# Patient Record
Sex: Male | Born: 1935 | Race: White | Hispanic: No | Marital: Married | Smoking: Never smoker
Health system: Southern US, Community
[De-identification: ages and names within clinical notes are randomized; demographics above are authoritative.]

## PROBLEM LIST (undated history)

## (undated) DIAGNOSIS — M51369 Other intervertebral disc degeneration, lumbar region without mention of lumbar back pain or lower extremity pain: Secondary | ICD-10-CM

## (undated) DIAGNOSIS — Z87442 Personal history of urinary calculi: Secondary | ICD-10-CM

## (undated) DIAGNOSIS — I10 Essential (primary) hypertension: Secondary | ICD-10-CM

## (undated) DIAGNOSIS — I447 Left bundle-branch block, unspecified: Secondary | ICD-10-CM

## (undated) DIAGNOSIS — M5136 Other intervertebral disc degeneration, lumbar region: Secondary | ICD-10-CM

## (undated) DIAGNOSIS — Z8719 Personal history of other diseases of the digestive system: Secondary | ICD-10-CM

## (undated) DIAGNOSIS — C4491 Basal cell carcinoma of skin, unspecified: Secondary | ICD-10-CM

---
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
## 2003-12-12 ENCOUNTER — Ambulatory Visit (HOSPITAL_COMMUNITY): Admission: RE | Admit: 2003-12-12 | Discharge: 2003-12-12 | Payer: Self-pay | Admitting: Gastroenterology

## 2010-11-11 ENCOUNTER — Encounter
Admission: RE | Admit: 2010-11-11 | Discharge: 2010-11-11 | Payer: Self-pay | Source: Home / Self Care | Attending: Gastroenterology | Admitting: Gastroenterology

## 2011-04-09 NOTE — Op Note (Signed)
NAME:  Albert Woods, Albert Woods NO.:  [id number]   MEDICAL RECORD NO.:  [id number]                   PATIENT TYPE:  AMB   LOCATION:  ENDO                                 FACILITY:  [facility]   PHYSICIAN:  Danise Edge, M.D.                DATE OF BIRTH:  04/27/1935   DATE OF PROCEDURE:  12/11/2002  DATE OF DISCHARGE:                                 OPERATIVE REPORT   PROCEDURE:  Colonoscopy.   PROCEDURE INDICATION:  [name] is a 76 year old male, born [birth year].  [name] had an episode of painless hematochezia with resolved.  Proctocolonoscopy to the cecum in November 1997 was normal.   CHRONIC MEDICATIONS:  None.   MEDICATION ALLERGIES:  PENICILLIN.   PAST MEDICAL HISTORY:  1. 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  2. Foot surgery on two occasions.  3. Kidney stones.  4. Prostatitis.  5. Ingrown toenail surgery.  6. Operation on the right third finger.  7. Peptic ulcer disease.  8. Normal proctocolonoscopy to the cecum, 1997.   HEALTH MAINTENANCE:  1. Tetanus toxoid booster, 1987  2. Pneumovax, September 2003.  3. Influenza vaccination, October 2004.   ENDOSCOPIST:  Charolett Bumpers, M.D.   PREMEDICATION:  1. Versed 5 mg.  2. Demerol 50 mg.   DESCRIPTION OF PROCEDURE:  After obtaining informed consent, Albert Woods was  placed in the left lateral decubitus position.  I administered intravenous  Demerol and intravenous Versed to achieve conscious sedation for the  procedure.  The patient's blood pressure, oxygen saturation, and cardiac  rhythm were monitored throughout the procedure and documented in the medical  record.   Anal inspection was normal.  Digital rectal exam revealed an enlarged but  nonnodular prostate.  The Olympus adjustable pediatric colonoscope was  introduced into the rectum and advanced to the cecum.  Colonic preparation  for the exam today was excellent.   RECTUM:  Normal.  SIGMOID COLON AND  DESCENDING COLON:  Left colonic diverticulosis without  diverticulitis or diverticular stricture formation.  SPLENIC FLEXURE:  Normal.  TRANSVERSE COLON:  Normal.  HEPATIC FLEXURE:  Normal.  ASCENDING COLON:  Normal.  CECUM AND ILEOCECAL VALVE:  Normal.   ASSESSMENT:  Normal screening proctocolonoscopy to the cecum.  No endoscopic  evidence for the presence of colorectal neoplasia, gastrointestinal  bleeding.                                               Danise Edge, M.D.    MJ/MEDQ  D:  12/12/2003  T:  12/12/2003  Job:  [id number]   cc:   Rozanna Boer., M.D.  [address]  [place]  [place] [address]  Fax: [phone]

## 2013-11-22 DIAGNOSIS — I447 Left bundle-branch block, unspecified: Secondary | ICD-10-CM

## 2013-11-22 HISTORY — DX: Left bundle-branch block, unspecified: I44.7

## 2014-09-18 ENCOUNTER — Emergency Department (HOSPITAL_COMMUNITY): Payer: Medicare Other

## 2014-09-18 ENCOUNTER — Inpatient Hospital Stay (HOSPITAL_COMMUNITY)
Admission: EM | Admit: 2014-09-18 | Discharge: 2014-09-19 | DRG: 310 | Disposition: A | Discharge status: Home / Self Care | Payer: Medicare Other | Attending: Cardiovascular Disease | Admitting: Cardiovascular Disease

## 2014-09-18 ENCOUNTER — Encounter (HOSPITAL_COMMUNITY): Payer: Self-pay | Admitting: Emergency Medicine

## 2014-09-18 DIAGNOSIS — I10 Essential (primary) hypertension: Secondary | ICD-10-CM | POA: Diagnosis present

## 2014-09-18 DIAGNOSIS — I447 Left bundle-branch block, unspecified: Principal | ICD-10-CM | POA: Diagnosis present

## 2014-09-18 DIAGNOSIS — I440 Atrioventricular block, first degree: Secondary | ICD-10-CM

## 2014-09-18 DIAGNOSIS — K219 Gastro-esophageal reflux disease without esophagitis: Secondary | ICD-10-CM | POA: Diagnosis present

## 2014-09-18 DIAGNOSIS — Z880 Allergy status to penicillin: Secondary | ICD-10-CM

## 2014-09-18 DIAGNOSIS — R9431 Abnormal electrocardiogram [ECG] [EKG]: Secondary | ICD-10-CM

## 2014-09-18 DIAGNOSIS — R079 Chest pain, unspecified: Secondary | ICD-10-CM | POA: Diagnosis present

## 2014-09-18 DIAGNOSIS — R072 Precordial pain: Secondary | ICD-10-CM

## 2014-09-18 HISTORY — DX: Essential (primary) hypertension: I10

## 2014-09-18 LAB — CBC
HCT: 46.1 % (ref 39.0–52.0)
Hemoglobin: 15.7 g/dL (ref 13.0–17.0)
MCH: 28.7 pg (ref 26.0–34.0)
MCHC: 34.1 g/dL (ref 30.0–36.0)
MCV: 84.3 fL (ref 78.0–100.0)
PLATELETS: 296 10*3/uL (ref 150–400)
RBC: 5.47 MIL/uL (ref 4.22–5.81)
RDW: 14.4 % (ref 11.5–15.5)
WBC: 7.3 10*3/uL (ref 4.0–10.5)

## 2014-09-18 LAB — BASIC METABOLIC PANEL
ANION GAP: 14 (ref 5–15)
BUN: 21 mg/dL (ref 6–23)
CALCIUM: 9.9 mg/dL (ref 8.4–10.5)
CO2: 26 mEq/L (ref 19–32)
Chloride: 98 mEq/L (ref 96–112)
Creatinine, Ser: 1.01 mg/dL (ref 0.50–1.35)
GFR, EST AFRICAN AMERICAN: 81 mL/min — AB (ref >90)
GFR, EST NON AFRICAN AMERICAN: 70 mL/min — AB (ref >90)
Glucose, Bld: 109 mg/dL — ABNORMAL HIGH (ref 70–99)
POTASSIUM: 4.2 meq/L (ref 3.7–5.3)
SODIUM: 138 meq/L (ref 137–147)

## 2014-09-18 LAB — I-STAT TROPONIN, ED: TROPONIN I, POC: 0 ng/mL (ref 0.00–0.08)

## 2014-09-18 MED ORDER — HYDROCHLOROTHIAZIDE 25 MG PO TABS
25.0000 mg | ORAL_TABLET | Freq: Every day | ORAL | Status: DC
Start: 2014-09-18 — End: 2014-09-19
  Administered 2014-09-18 – 2014-09-19 (×2): 25 mg via ORAL
  Filled 2014-09-18 (×2): qty 1

## 2014-09-18 MED ORDER — ASPIRIN EC 81 MG PO TBEC
81.0000 mg | DELAYED_RELEASE_TABLET | Freq: Every day | ORAL | Status: DC
  Administered 2014-09-19: 81 mg via ORAL
  Filled 2014-09-18: qty 1

## 2014-09-18 MED ORDER — REGADENOSON 0.4 MG/5ML IV SOLN
0.4000 mg | Freq: Once | INTRAVENOUS | Status: AC
  Administered 2014-09-19: 0.4 mg via INTRAVENOUS
  Filled 2014-09-18: qty 5

## 2014-09-18 MED ORDER — ACETAMINOPHEN 325 MG PO TABS: 650.0000 mg | ORAL_TABLET | ORAL | Status: DC | PRN

## 2014-09-18 MED ORDER — HEPARIN SODIUM (PORCINE) 5000 UNIT/ML IJ SOLN
5000.0000 [IU] | Freq: Three times a day (TID) | INTRAMUSCULAR | Status: DC
  Filled 2014-09-18 (×3): qty 1

## 2014-09-18 MED ORDER — NITROGLYCERIN 0.4 MG SL SUBL: 0.4000 mg | SUBLINGUAL_TABLET | SUBLINGUAL | Status: DC | PRN

## 2014-09-18 NOTE — ED Notes (Signed)
Attempted report x1. 

## 2014-09-18 NOTE — ED Provider Notes (Signed)
CSN: [id number]     Arrival date & time 09/18/14  1050 History   First MD Initiated Contact with Patient 09/18/14 1111     Chief Complaint  Patient presents with  . Gastrophageal Reflux  . Chest Pain     (Consider location/radiation/quality/duration/timing/severity/associated sxs/prior Treatment) HPI Comments: Patient is a 79 yo M PMHx significant for HTN presenting to the emergency department from [place] urgent care center for EKG abnormalities. Patient states he went to the urgent care center for 3 day history of left lower chest pain that he describes as indigestion or a pulled muscle. He denies any nausea, vomiting, shortness of breath, abdominal pain, fever, chills, leg swelling. Patient states his symptoms have improved after GI cocktail at the urgent care center. He states he has never had chest pain in the past, has not had an EKG since the 70s at [place] primary care office which she was told was normal. He has never had an echocardiogram, cardiac catheterization, stress test.  Patient is a 79 y.o. male presenting with GERD and chest pain.  Gastrophageal Reflux Associated symptoms include chest pain.  Chest Pain   Past Medical History  Diagnosis Date  . Hypertension    History reviewed. No pertinent past surgical history. History reviewed. No pertinent family history. History  Substance Use Topics  . Smoking status: Never Smoker   . Smokeless tobacco: Not on file  . Alcohol Use: No    Review of Systems  Cardiovascular: Positive for chest pain.  All other systems reviewed and are negative.     Allergies  Penicillins  Home Medications   Prior to Admission medications   Medication Sig Start Date End Date Taking? Authorizing Provider  fluticasone (FLONASE) 50 MCG/ACT nasal spray Place 1 spray into both nostrils daily as needed for allergies or rhinitis.   Yes Historical Provider, MD  hydrochlorothiazide (HYDRODIURIL) 25 MG tablet Take 25 mg by mouth daily.   Yes  Historical Provider, MD  ibuprofen (ADVIL,MOTRIN) 200 MG tablet Take 200 mg by mouth every 6 (six) hours as needed for mild pain.   Yes Historical Provider, MD  Multiple Vitamin (MULTIVITAMIN WITH MINERALS) TABS tablet Take 1 tablet by mouth daily.   Yes Historical Provider, MD   BP 129/84  Pulse 63  Temp(Src) 97.6 F (36.4 C)  Resp 24  Ht 5\' 8"  (1.727 m)  Wt 180 lb (81.647 kg)  BMI 27.38 kg/m2  SpO2 95% Physical Exam  Nursing note and vitals reviewed. Constitutional: He is oriented to person, place, and time. He appears well-developed and well-nourished. No distress.  HENT:  Head: Normocephalic and atraumatic.  Right Ear: External ear normal.  Left Ear: External ear normal.  Nose: Nose normal.  Mouth/Throat: Oropharynx is clear and moist. No oropharyngeal exudate.  Eyes: Conjunctivae are normal.  Neck: Neck supple.  Cardiovascular: Normal rate, regular rhythm, normal heart sounds and intact distal pulses.   Pulmonary/Chest: Effort normal and breath sounds normal. No respiratory distress. He exhibits no tenderness.  Abdominal: Soft. Bowel sounds are normal. There is no tenderness.  Neurological: He is alert and oriented to person, place, and time.  Skin: Skin is warm and dry. No rash noted. He is not diaphoretic.    ED Course  Procedures (including critical care time) Medications - No data to display  Labs Review Labs Reviewed  BASIC METABOLIC PANEL - Abnormal; Notable for the following:    Glucose, Bld 109 (*)    GFR calc non Af Amer 70 (*)  GFR calc Af Amer 81 (*)    All other components within normal limits  CBC  I-STAT TROPOININ, ED    Imaging Review Dg Chest Port 1 View  09/18/2014   CLINICAL DATA:  EKG abnormalities. Atypical chest pain. Indigestion. Hypertension.  EXAM: PORTABLE CHEST - 1 VIEW  COMPARISON:  None.  FINDINGS: The heart size and mediastinal contours are within normal limits. Both lungs are clear. Probable small to moderate hiatal hernia noted.  The visualized skeletal structures are unremarkable.  IMPRESSION: No active lung disease.  Probable small to moderate hiatal hernia.   Electronically Signed   By: Earle Gell M.D.   On: 09/18/2014 14:07     EKG Interpretation   Date/Time:  Wednesday September 18 2014 10:55:50 EDT Ventricular Rate:  81 PR Interval:  218 QRS Duration: 158 QT Interval:  398 QTC Calculation: 462 R Axis:   24 Text Interpretation:  Sinus rhythm with 1st degree A-V block with  Premature atrial complexes Left bundle branch block Abnormal ECG Confirmed  by Jeneen Rinks  MD, [place] ([id number]) on 09/18/2014 11:13:04 AM      MDM   Final diagnoses:  EKG abnormalities   Filed Vitals:   09/18/14 1500  BP: 129/84  Pulse: 63  Temp:   Resp: 24   Afebrile, NAD, non-toxic appearing, AAOx4.   Patient sent from [facility] for EKG Abnormalities along with three days of left sided chest pain. Noted to have a LBBB, no history of LBBB. Patient states the only EKG he has had in the past was in the 1970s which was normal. Patient is CP free currently. Labs reviewed. CXR reviewed. Cardiology was consulted and will see the patient and admit for cardiac enzyme cycling with possible stress test in the AM for evaluation for ischemia. Patient d/w with Dr. Jeneen Rinks, agrees with plan.      Harlow Mares, PA-C 09/18/14 1644  Tanna Furry, MD 09/27/14 [phone]

## 2014-09-18 NOTE — H&P (Signed)
Patient ID: Albert Woods MRN: [id number], DOB/AGE: 1935-08-17   Admit date: 09/18/2014  Primary Physician: Garlan Fair, MD Primary Cardiologist: new  Pt. Profile:  Albert Woods is a 79 year old male who is being seen today for consultation regarding a three day history of left sided chest pain.  Problem List  Past Medical History  Diagnosis Date  . Hypertension     History reviewed. No pertinent past surgical history.   Allergies  Allergies  Allergen Reactions  . Penicillins     HPI  Albert Woods is a 79 year old caucasian male who is being seen today for consultation regarding left sided chest pain that has been present for three days.  Other than a history of hypertension, for which he takes HCTZ 25mg , he does not have any additional cardiac history.  Last ECG was in the 1970's, and has never had stress test, echo, catheterization, or cardiac surgeries.  Father had no cardiac history, mother had murmur that was present since birth, and one brother had 2 MI's and 1 CVA and is living at 76 years of age.  The chest pain occurred at rest and has been present since Monday evening and describes pain as indigestion that is "burning" with the sensation of a "pulled muscle." He admits to lifting a heavy generator off of his truck Monday afternoon.  Burning pain was relieved Monday night with a Tums, returned Tuesday night with temporary relief of Tums and Omeprazole. Pain then returned this morning so he then presented to an urgent care center in [place] where an ECG was performed noting an irregular rhythm and was given a GI cocktail which alleviated the pain completely.  During episodes of left sided chest pain he did not have shortness of breath, dizziness, diaphoresis, palpitations, or radiation of pain.  No pain at this time.   Home Medications  Prior to Admission medications   Not on File    Family History  History reviewed. No pertinent family history. Mother had murmur  that was present at birth, Brother- MI x 2 and CVA. Currently living at 77 years of age.  Social History  History   Social History  . Marital Status: Single    Spouse Name: N/A    Number of Children: N/A  . Years of Education: N/A   Occupational History  . Not on file.   Social History Main Topics  . Smoking status: Never Smoker   . Smokeless tobacco: Not on file  . Alcohol Use: No  . Drug Use: Not on file  . Sexual Activity: Not on file   Other Topics Concern  . Not on file   Social History Narrative  . No narrative on file     Review of Systems General:  No chills, fever, night sweats or weight changes.  Cardiovascular:  No dyspnea on exertion, edema, orthopnea, palpitations, paroxysmal nocturnal dyspnea. Left sided intermittent "burning" chest pain since Monday evening, relieved temporarily with Tums. Dermatological: No rash, lesions/masses Respiratory: No cough, dyspnea Urologic: No hematuria, dysuria Abdominal:   No nausea, vomiting, diarrhea, bright red blood per rectum, melena, or hematemesis Neurologic:  No visual changes, wkns, changes in mental status. All other systems reviewed and are otherwise negative except as noted above.  Physical Exam  Blood pressure 140/77, pulse 63, temperature 97.6 F (36.4 C), resp. rate 18, height 5\' 8"  (1.727 m), weight 180 lb (81.647 kg), SpO2 96.00%.  General: Pleasant, NAD Psych: Normal affect. Neuro: Alert and  oriented X 3. Moves all extremities spontaneously. HEENT: Normal  Neck: Supple without bruits or JVD. Lungs:  Resp regular and unlabored, CTA. Heart: Rate WNL, rhythm irregular on monitor, no murmurs.  Abdomen: Soft, non-tender, non-distended, BS + x 4.  Extremities: No clubbing, cyanosis or edema. DP/PT/Radials 2+ and equal bilaterally.  Labs  Troponin [facility] of Care Test)  Recent Labs  09/18/14 1149  TROPIPOC 0.00   No results found for this basename: CKTOTAL, CKMB, TROPONINI,  in the last 72 hours Lab  Results  Component Value Date   WBC 7.3 09/18/2014   HGB 15.7 09/18/2014   HCT 46.1 09/18/2014   MCV 84.3 09/18/2014   PLT 296 09/18/2014    Recent Labs Lab 09/18/14 1102  NA 138  K 4.2  CL 98  CO2 26  BUN 21  CREATININE 1.01  CALCIUM 9.9  GLUCOSE 109*   Radiology/Studies  No results found.  ECG  Sinus rhythm with 1st degree AV block and LBBB.  ASSESSMENT AND PLAN  1.  Chest pain: Three day history of left sided chest pain that is described as "burning" in nature with the sensation of a "pulled muscle" which has been temporarily relieved with Tums. He presented to an urgent care center in [place] this morning where an irregular rhythm was noted on ECG and was sent to [facility] for further evaluation. POC tropoin is negative. CXR unremarkable other than a probable small hiatal hernia. CBC and BMP unremarkable. Currently chest pain free. Although symptoms are somewhat atypical, he is 79 and has never had an ischemic evaluation. Would recommend admitting to observation overnight. Will continue to cycle cardiac enzymes and plan for NST in the am if negative. Can place on a trial of PPI therapy with Protonix. NPO at midnight. Fasting lipid panel in the am for risk assessment. Repeat EKG in the am.   1. HTN: BP is well controlled. Continue home HCTZ.    Signed, [place], [place] 09/18/2014  I have seen and examined the patient along with Lyda Jester, PA.  I have reviewed the chart, notes and new data.  I agree with PA's note.  Key new complaints: chest pain symptoms sound more consistent with GI rather than cardiac etiology Key examination changes: frequent ectopy, paradoxically split S2 Key new findings / data: newly recognized LBBB  PLAN: Evaluation for CAD is more challenging due to LBBB. Reasonable to perform Lexiscan Myoview in AM if normal serial enzymes.  Sanda Klein, MD, [place] [phone] 09/18/2014, 4:13 PM

## 2014-09-18 NOTE — ED Notes (Signed)
Per pt went to se PCP about indigestion and chest pain. Sent here for abnormal EKG. Denies pain and was given gi cocktail at office.

## 2014-09-18 NOTE — ED Notes (Signed)
Patient given water per MD approval. 

## 2014-09-18 NOTE — ED Notes (Signed)
Portable Xray at the bedside. Approved patient.

## 2014-09-18 NOTE — ED Notes (Signed)
Pt states "Monday I was eating peanuts and began to have left sided chest/rib pain (points to side of rib cage rather than front of chest), I also had indigestion and took tums with no relief." Still c/o indigestion.

## 2014-09-19 ENCOUNTER — Encounter (HOSPITAL_COMMUNITY): Payer: Self-pay | Admitting: Physician Assistant

## 2014-09-19 ENCOUNTER — Inpatient Hospital Stay (HOSPITAL_COMMUNITY): Payer: Medicare Other

## 2014-09-19 DIAGNOSIS — R072 Precordial pain: Secondary | ICD-10-CM

## 2014-09-19 DIAGNOSIS — R079 Chest pain, unspecified: Secondary | ICD-10-CM

## 2014-09-19 LAB — BASIC METABOLIC PANEL
Anion gap: 12 (ref 5–15)
BUN: 20 mg/dL (ref 6–23)
CO2: 27 meq/L (ref 19–32)
Calcium: 9.6 mg/dL (ref 8.4–10.5)
Chloride: 100 mEq/L (ref 96–112)
Creatinine, Ser: 0.94 mg/dL (ref 0.50–1.35)
GFR calc non Af Amer: 79 mL/min — ABNORMAL LOW (ref >90)
Glucose, Bld: 92 mg/dL (ref 70–99)
POTASSIUM: 4 meq/L (ref 3.7–5.3)
SODIUM: 139 meq/L (ref 137–147)

## 2014-09-19 LAB — LIPID PANEL
CHOL/HDL RATIO: 3 ratio
CHOLESTEROL: 160 mg/dL (ref 0–200)
HDL: 53 mg/dL (ref >39)
LDL Cholesterol: 91 mg/dL (ref 0–99)
Triglycerides: 78 mg/dL (ref <150)
VLDL: 16 mg/dL (ref 0–40)

## 2014-09-19 MED ORDER — PANTOPRAZOLE SODIUM 40 MG PO TBEC: 40.0000 mg | DELAYED_RELEASE_TABLET | Freq: Every day | ORAL | Status: DC

## 2014-09-19 MED ORDER — REGADENOSON 0.4 MG/5ML IV SOLN
INTRAVENOUS | Status: AC
  Administered 2014-09-19: 0.4 mg via INTRAVENOUS
  Filled 2014-09-19: qty 5

## 2014-09-19 MED ORDER — ASPIRIN 81 MG PO TBEC: 81.0000 mg | DELAYED_RELEASE_TABLET | Freq: Every day | ORAL | Status: DC

## 2014-09-19 MED ORDER — TECHNETIUM TC 99M SESTAMIBI - CARDIOLITE
10.0000 | Freq: Once | INTRAVENOUS | Status: AC | PRN
  Administered 2014-09-19: 10 via INTRAVENOUS

## 2014-09-19 MED ORDER — TECHNETIUM TC 99M SESTAMIBI - CARDIOLITE
30.0000 | Freq: Once | INTRAVENOUS | Status: AC | PRN
  Administered 2014-09-19: 30 via INTRAVENOUS

## 2014-09-19 NOTE — Discharge Summary (Signed)
  Physician Discharge Summary  Patient ID: Albert Woods MRN: [id number] DOB/AGE: 77/06/1935 77 y.o.  Primary Cardiologist: Dr. Sallyanne Kuster (new)  Admit date: 09/18/2014 Discharge date: 09/19/2014  Admission Diagnoses: Atypical chest pain with left bundle branch block  Discharge Diagnoses:  Active Problems:   Chest pain   Discharged Condition: stable  Hospital Course: The patient is a 79 y/o male with a history of HTN on HCTZ and no prior cardiac history, who presented to the [facility] ER on 09/18/14 with atypical chest pain, described as left sided sharp pain directly below his rib cage, relieved with belching and antacids. He denied substernal chest pressure, tightness and no exertional component. No other associated symptoms. EKG in the ER demonstrated a LBBB. This was newly recognized as there were no prior EKGs to compare to. Given his LBBB and chest pain, he was admitted for observation. Cardiac enzymes were negative. He had no further pain overnight . He underwent a NST that was low risk for ischemia. EF was normal at 55%. Vital signs remained stable. He was seen and examined by Dr. Angelena Form who determined he was stable for discharge home. He was prescribed Protonix for possible GERD. He will f/u with Dr. Sallyanne Kuster as needed.   Consults: None  Significant Diagnostic Studies:   NST 09/19/14 FINDINGS:  Perfusion: No decreased activity in the left ventricle on stress  imaging to suggest reversible ischemia or infarction.  Wall Motion: Normal left ventricular wall motion. No left  ventricular dilation.  Left Ventricular Ejection Fraction: 55 %  End diastolic volume 559 ml  End systolic volume 48 ml  IMPRESSION:  1. No reversible ischemia or infarction.  2. Normal left ventricular wall motion.  3. Left ventricular ejection fraction 55%  4. Low-risk stress test findings*.   Treatments: See Hospital Course  Discharge Exam: Blood pressure 115/51, pulse 66, temperature 98.4 F (36.9  C), temperature source Oral, resp. rate 18, height 5' 7.5" (1.715 m), weight 172 lb 6.4 oz (78.2 kg), SpO2 96.00%.   Disposition: Final discharge disposition not confirmed      Discharge Instructions   Diet - low sodium heart healthy    Complete by:  As directed      Increase activity slowly    Complete by:  As directed             Medication List         aspirin 81 MG EC tablet  Take 1 tablet (81 mg total) by mouth daily.     fluticasone 50 MCG/ACT nasal spray  Commonly known as:  FLONASE  Place 1 spray into both nostrils daily as needed for allergies or rhinitis.     hydrochlorothiazide 25 MG tablet  Commonly known as:  HYDRODIURIL  Take 25 mg by mouth daily.     ibuprofen 200 MG tablet  Commonly known as:  ADVIL,MOTRIN  Take 200 mg by mouth every 6 (six) hours as needed for mild pain.     multivitamin with minerals Tabs tablet  Take 1 tablet by mouth daily.     pantoprazole 40 MG tablet  Commonly known as:  PROTONIX  Take 1 tablet (40 mg total) by mouth daily.       TIME SPENT ON DISCHARGE INCLUDING PHYSICIAN TIME: >30 MINUTES   Signed: Lyda Jester 09/19/2014, 5:20 PM

## 2014-09-19 NOTE — Progress Notes (Signed)
Patient discharged to home, transported by wife.  IV removed prior to discharge; IV site clean, dry, and intact.  Discharge instructions, education, and medications discussed with patient and wife prior to discharge; both voice understanding of information presented and deny questions or concerns.  Prescription for aspirin EC not provided; per Brittainy, Cardiology PA, patient can take OTC 81mg  aspirin as ordered.  Patient and wife aware.

## 2014-09-19 NOTE — Progress Notes (Signed)
  Patient Profile: 79 y/o male with no prior cardiac history, admitted for evaluation of atypical chest pain and newly recognized LBBB (no prior EKG to compare to).   Subjective: No recurrent CP since admission.   Objective: Vital signs in last 24 hours: Temp:  [97.6 F (36.4 C)-98.4 F (36.9 C)] 98 F (36.7 C) (10/29 0435) Pulse Rate:  [29-88] 66 (10/29 0435) Resp:  [14-24] 16 (10/29 0435) BP: (109-156)/(57-96) 138/67 mmHg (10/29 0955) SpO2:  [93 %-99 %] 96 % (10/29 0435) Weight:  [172 lb 6.4 oz (78.2 kg)-180 lb (81.647 kg)] 172 lb 6.4 oz (78.2 kg) (10/29 0435) Last BM Date: 09/18/14  Intake/Output from previous day: 10/28 0701 - 10/29 0700 In: 240 [P.O.:240] Out: 600 [Urine:600] Intake/Output this shift:    Medications Current Facility-Administered Medications  Medication Dose Route Frequency Provider Last Rate Last Dose  . acetaminophen (TYLENOL) tablet 650 mg  650 mg Oral Q4H PRN Brittainy M Simmons, PA-C      . aspirin EC tablet 81 mg  81 mg Oral Daily Brittainy M Simmons, PA-C      . heparin injection 5,000 Units  5,000 Units Subcutaneous 3 times per day Brittainy Erie Noe, PA-C      . hydrochlorothiazide (HYDRODIURIL) tablet 25 mg  25 mg Oral Daily Brittainy M Simmons, PA-C   25 mg at 09/18/14 2026  . nitroGLYCERIN (NITROSTAT) SL tablet 0.4 mg  0.4 mg Sublingual Q5 Min x 3 PRN Brittainy Erie Noe, PA-C        PE: General appearance: alert, cooperative and no distress Neck: no carotid bruit and no JVD Lungs: clear to auscultation bilaterally Heart: regular rate and rhythm, S1, S2 normal, no murmur, click, rub or gallop Extremities: no LEE Pulses: 2+ and symmetric Skin: warm and dry Neurologic: Grossly normal  Lab Results:   Recent Labs  09/18/14 1102  WBC 7.3  HGB 15.7  HCT 46.1  PLT 296   BMET  Recent Labs  09/18/14 1102 09/19/14 0430  NA 138 139  K 4.2 4.0  CL 98 100  CO2 26 27  GLUCOSE 109* 92  BUN 21 20  CREATININE 1.01 0.94  CALCIUM 9.9  9.6   PT/INR No results found for this basename: LABPROT, INR,  in the last 72 hours Cholesterol  Recent Labs  09/19/14 0430  CHOL 160    Assessment/Plan    Active Problems:   Chest pain  1. Atypical Chest pain: troponin negative. No further recurrence since admission. Lexiscan NST competed. Radiologist interpretation to follow. D/C home if normal. Patient feels recent discomfort may be related to gas. Symptoms relieved with belching and antacids. If NST is negative for ischemia, can discharge with trial of PPI therapy with Protonix.   Note: lipid panel is WNL with LDL <100. If w/u suggest no CAD then no indication for statin.     LOS: 1 day    Brittainy M. Ladoris Gene 09/19/2014 10:21 AM  I have personally seen and examined this patient with Lyda Jester, PA-C I agree with the assessment and plan as outlined above. Chest pain is atypical, no recurrence. If stress test is ok, can go home today.   MCALHANY,CHRISTOPHER 09/19/2014 11:26 AM

## 2014-09-19 NOTE — Care Management Note (Signed)
    Page 1 of 1   09/19/2014     3:27:38 PM CARE MANAGEMENT NOTE 09/19/2014  Patient:  Albert Woods, Albert Woods   Account Number:  [id number]  Date Initiated:  09/19/2014  Documentation initiated by:  [facility]  Subjective/Objective Assessment:   79 year old male who is being seen today for consultation regarding a three day history of left sided chest pain.//Home with spouse.     Action/Plan:   cycle cardiac enzymes and NST   Anticipated DC Date:  09/19/2014   Anticipated DC Plan:  HOME/SELF CARE         Choice offered to / List presented to:             Status of service:  Completed, signed off Medicare Important Message given?   (If response is "NO", the following Medicare IM given date fields will be blank) Date Medicare IM given:   Medicare IM given by:   Date Additional Medicare IM given:   Additional Medicare IM given by:    Discharge Disposition:    Per UR Regulation:  Reviewed for med. necessity/level of care/duration of stay  If discussed at [place] of Stay Meetings, dates discussed:    Comments:

## 2014-11-28 DIAGNOSIS — J309 Allergic rhinitis, unspecified: Secondary | ICD-10-CM | POA: Diagnosis not present

## 2015-01-22 DIAGNOSIS — I10 Essential (primary) hypertension: Secondary | ICD-10-CM | POA: Diagnosis not present

## 2015-01-22 DIAGNOSIS — M4806 Spinal stenosis, lumbar region: Secondary | ICD-10-CM | POA: Diagnosis not present

## 2015-01-22 DIAGNOSIS — E780 Pure hypercholesterolemia: Secondary | ICD-10-CM | POA: Diagnosis not present

## 2015-01-22 DIAGNOSIS — Z23 Encounter for immunization: Secondary | ICD-10-CM | POA: Diagnosis not present

## 2015-01-22 DIAGNOSIS — H269 Unspecified cataract: Secondary | ICD-10-CM | POA: Diagnosis not present

## 2015-01-22 DIAGNOSIS — Z0001 Encounter for general adult medical examination with abnormal findings: Secondary | ICD-10-CM | POA: Diagnosis not present

## 2015-01-22 DIAGNOSIS — I447 Left bundle-branch block, unspecified: Secondary | ICD-10-CM | POA: Diagnosis not present

## 2015-01-22 DIAGNOSIS — H9190 Unspecified hearing loss, unspecified ear: Secondary | ICD-10-CM | POA: Diagnosis not present

## 2015-03-12 DIAGNOSIS — N200 Calculus of kidney: Secondary | ICD-10-CM | POA: Diagnosis not present

## 2015-03-12 DIAGNOSIS — N5201 Erectile dysfunction due to arterial insufficiency: Secondary | ICD-10-CM | POA: Diagnosis not present

## 2015-06-19 DIAGNOSIS — L570 Actinic keratosis: Secondary | ICD-10-CM | POA: Diagnosis not present

## 2015-06-19 DIAGNOSIS — L821 Other seborrheic keratosis: Secondary | ICD-10-CM | POA: Diagnosis not present

## 2015-06-19 DIAGNOSIS — D1801 Hemangioma of skin and subcutaneous tissue: Secondary | ICD-10-CM | POA: Diagnosis not present

## 2015-08-25 DIAGNOSIS — L570 Actinic keratosis: Secondary | ICD-10-CM | POA: Diagnosis not present

## 2015-09-08 DIAGNOSIS — Z23 Encounter for immunization: Secondary | ICD-10-CM | POA: Diagnosis not present

## 2015-10-15 DIAGNOSIS — M25511 Pain in right shoulder: Secondary | ICD-10-CM | POA: Diagnosis not present

## 2016-01-28 DIAGNOSIS — I10 Essential (primary) hypertension: Secondary | ICD-10-CM | POA: Diagnosis not present

## 2016-01-28 DIAGNOSIS — I447 Left bundle-branch block, unspecified: Secondary | ICD-10-CM | POA: Diagnosis not present

## 2016-01-28 DIAGNOSIS — E7800 Pure hypercholesterolemia, unspecified: Secondary | ICD-10-CM | POA: Diagnosis not present

## 2016-01-28 DIAGNOSIS — H9190 Unspecified hearing loss, unspecified ear: Secondary | ICD-10-CM | POA: Diagnosis not present

## 2016-01-28 DIAGNOSIS — M4806 Spinal stenosis, lumbar region: Secondary | ICD-10-CM | POA: Diagnosis not present

## 2016-01-28 DIAGNOSIS — Z0000 Encounter for general adult medical examination without abnormal findings: Secondary | ICD-10-CM | POA: Diagnosis not present

## 2016-01-28 DIAGNOSIS — H269 Unspecified cataract: Secondary | ICD-10-CM | POA: Diagnosis not present

## 2016-05-31 DIAGNOSIS — R21 Rash and other nonspecific skin eruption: Secondary | ICD-10-CM | POA: Diagnosis not present

## 2016-05-31 DIAGNOSIS — W57XXXA Bitten or stung by nonvenomous insect and other nonvenomous arthropods, initial encounter: Secondary | ICD-10-CM | POA: Diagnosis not present

## 2016-06-21 DIAGNOSIS — D1801 Hemangioma of skin and subcutaneous tissue: Secondary | ICD-10-CM | POA: Diagnosis not present

## 2016-06-21 DIAGNOSIS — L821 Other seborrheic keratosis: Secondary | ICD-10-CM | POA: Diagnosis not present

## 2016-06-21 DIAGNOSIS — L570 Actinic keratosis: Secondary | ICD-10-CM | POA: Diagnosis not present

## 2016-08-19 DIAGNOSIS — Z23 Encounter for immunization: Secondary | ICD-10-CM | POA: Diagnosis not present

## 2016-10-19 DIAGNOSIS — L570 Actinic keratosis: Secondary | ICD-10-CM | POA: Diagnosis not present

## 2016-10-19 DIAGNOSIS — L821 Other seborrheic keratosis: Secondary | ICD-10-CM | POA: Diagnosis not present

## 2017-02-02 DIAGNOSIS — H9190 Unspecified hearing loss, unspecified ear: Secondary | ICD-10-CM | POA: Diagnosis not present

## 2017-02-02 DIAGNOSIS — E7800 Pure hypercholesterolemia, unspecified: Secondary | ICD-10-CM | POA: Diagnosis not present

## 2017-02-02 DIAGNOSIS — M48061 Spinal stenosis, lumbar region without neurogenic claudication: Secondary | ICD-10-CM | POA: Diagnosis not present

## 2017-02-02 DIAGNOSIS — I447 Left bundle-branch block, unspecified: Secondary | ICD-10-CM | POA: Diagnosis not present

## 2017-02-02 DIAGNOSIS — I10 Essential (primary) hypertension: Secondary | ICD-10-CM | POA: Diagnosis not present

## 2017-02-02 DIAGNOSIS — Z0001 Encounter for general adult medical examination with abnormal findings: Secondary | ICD-10-CM | POA: Diagnosis not present

## 2017-03-31 DIAGNOSIS — H2513 Age-related nuclear cataract, bilateral: Secondary | ICD-10-CM | POA: Diagnosis not present

## 2017-03-31 DIAGNOSIS — H353120 Nonexudative age-related macular degeneration, left eye, stage unspecified: Secondary | ICD-10-CM | POA: Diagnosis not present

## 2017-06-22 DIAGNOSIS — D485 Neoplasm of uncertain behavior of skin: Secondary | ICD-10-CM | POA: Diagnosis not present

## 2017-06-22 DIAGNOSIS — C44319 Basal cell carcinoma of skin of other parts of face: Secondary | ICD-10-CM | POA: Diagnosis not present

## 2017-06-22 DIAGNOSIS — L570 Actinic keratosis: Secondary | ICD-10-CM | POA: Diagnosis not present

## 2017-06-22 DIAGNOSIS — L821 Other seborrheic keratosis: Secondary | ICD-10-CM | POA: Diagnosis not present

## 2017-06-22 DIAGNOSIS — D2261 Melanocytic nevi of right upper limb, including shoulder: Secondary | ICD-10-CM | POA: Diagnosis not present

## 2017-10-10 DIAGNOSIS — Z23 Encounter for immunization: Secondary | ICD-10-CM | POA: Diagnosis not present

## 2017-11-26 DIAGNOSIS — R42 Dizziness and giddiness: Secondary | ICD-10-CM | POA: Diagnosis not present

## 2017-12-26 DIAGNOSIS — L570 Actinic keratosis: Secondary | ICD-10-CM | POA: Diagnosis not present

## 2017-12-26 DIAGNOSIS — Z85828 Personal history of other malignant neoplasm of skin: Secondary | ICD-10-CM | POA: Diagnosis not present

## 2017-12-26 DIAGNOSIS — D485 Neoplasm of uncertain behavior of skin: Secondary | ICD-10-CM | POA: Diagnosis not present

## 2017-12-26 DIAGNOSIS — L821 Other seborrheic keratosis: Secondary | ICD-10-CM | POA: Diagnosis not present

## 2017-12-26 DIAGNOSIS — D225 Melanocytic nevi of trunk: Secondary | ICD-10-CM | POA: Diagnosis not present

## 2017-12-26 DIAGNOSIS — C44311 Basal cell carcinoma of skin of nose: Secondary | ICD-10-CM | POA: Diagnosis not present

## 2018-01-07 DIAGNOSIS — J0100 Acute maxillary sinusitis, unspecified: Secondary | ICD-10-CM | POA: Diagnosis not present

## 2018-01-11 DIAGNOSIS — C44311 Basal cell carcinoma of skin of nose: Secondary | ICD-10-CM | POA: Diagnosis not present

## 2018-01-11 DIAGNOSIS — Z85828 Personal history of other malignant neoplasm of skin: Secondary | ICD-10-CM | POA: Diagnosis not present

## 2018-02-22 DIAGNOSIS — Z0000 Encounter for general adult medical examination without abnormal findings: Secondary | ICD-10-CM | POA: Diagnosis not present

## 2018-02-22 DIAGNOSIS — M4807 Spinal stenosis, lumbosacral region: Secondary | ICD-10-CM | POA: Diagnosis not present

## 2018-02-22 DIAGNOSIS — Z87442 Personal history of urinary calculi: Secondary | ICD-10-CM | POA: Diagnosis not present

## 2018-02-22 DIAGNOSIS — Z87438 Personal history of other diseases of male genital organs: Secondary | ICD-10-CM | POA: Diagnosis not present

## 2018-02-22 DIAGNOSIS — E785 Hyperlipidemia, unspecified: Secondary | ICD-10-CM | POA: Diagnosis not present

## 2018-02-22 DIAGNOSIS — I10 Essential (primary) hypertension: Secondary | ICD-10-CM | POA: Diagnosis not present

## 2018-02-22 DIAGNOSIS — Z23 Encounter for immunization: Secondary | ICD-10-CM | POA: Diagnosis not present

## 2018-02-22 DIAGNOSIS — H9190 Unspecified hearing loss, unspecified ear: Secondary | ICD-10-CM | POA: Diagnosis not present

## 2018-02-22 DIAGNOSIS — I447 Left bundle-branch block, unspecified: Secondary | ICD-10-CM | POA: Diagnosis not present

## 2018-02-22 DIAGNOSIS — Z1389 Encounter for screening for other disorder: Secondary | ICD-10-CM | POA: Diagnosis not present

## 2018-07-04 DIAGNOSIS — L570 Actinic keratosis: Secondary | ICD-10-CM | POA: Diagnosis not present

## 2018-07-04 DIAGNOSIS — D692 Other nonthrombocytopenic purpura: Secondary | ICD-10-CM | POA: Diagnosis not present

## 2018-07-04 DIAGNOSIS — Z85828 Personal history of other malignant neoplasm of skin: Secondary | ICD-10-CM | POA: Diagnosis not present

## 2018-07-04 DIAGNOSIS — L821 Other seborrheic keratosis: Secondary | ICD-10-CM | POA: Diagnosis not present

## 2018-08-03 DIAGNOSIS — R21 Rash and other nonspecific skin eruption: Secondary | ICD-10-CM | POA: Diagnosis not present

## 2018-11-02 DIAGNOSIS — Z23 Encounter for immunization: Secondary | ICD-10-CM | POA: Diagnosis not present

## 2018-11-24 DIAGNOSIS — R05 Cough: Secondary | ICD-10-CM | POA: Diagnosis not present

## 2019-01-03 DIAGNOSIS — B029 Zoster without complications: Secondary | ICD-10-CM | POA: Diagnosis not present

## 2019-03-26 ENCOUNTER — Emergency Department (HOSPITAL_COMMUNITY)
Admission: EM | Admit: 2019-03-26 | Discharge: 2019-03-26 | Disposition: A | Discharge status: Home / Self Care | Payer: Medicare Other | Attending: Emergency Medicine | Admitting: Emergency Medicine

## 2019-03-26 ENCOUNTER — Encounter (HOSPITAL_COMMUNITY): Payer: Self-pay | Admitting: *Deleted

## 2019-03-26 ENCOUNTER — Telehealth (HOSPITAL_COMMUNITY): Payer: Self-pay | Admitting: Emergency Medicine

## 2019-03-26 ENCOUNTER — Telehealth: Payer: Self-pay | Admitting: Surgery

## 2019-03-26 ENCOUNTER — Emergency Department (HOSPITAL_COMMUNITY): Payer: Medicare Other

## 2019-03-26 ENCOUNTER — Other Ambulatory Visit: Payer: Self-pay

## 2019-03-26 DIAGNOSIS — N130 Hydronephrosis with ureteropelvic junction obstruction: Secondary | ICD-10-CM | POA: Insufficient documentation

## 2019-03-26 DIAGNOSIS — N201 Calculus of ureter: Secondary | ICD-10-CM | POA: Diagnosis not present

## 2019-03-26 DIAGNOSIS — N3001 Acute cystitis with hematuria: Secondary | ICD-10-CM | POA: Diagnosis not present

## 2019-03-26 DIAGNOSIS — I10 Essential (primary) hypertension: Secondary | ICD-10-CM | POA: Insufficient documentation

## 2019-03-26 DIAGNOSIS — N390 Urinary tract infection, site not specified: Secondary | ICD-10-CM

## 2019-03-26 DIAGNOSIS — R10813 Right lower quadrant abdominal tenderness: Secondary | ICD-10-CM | POA: Diagnosis not present

## 2019-03-26 DIAGNOSIS — N200 Calculus of kidney: Secondary | ICD-10-CM | POA: Insufficient documentation

## 2019-03-26 DIAGNOSIS — R319 Hematuria, unspecified: Secondary | ICD-10-CM | POA: Diagnosis not present

## 2019-03-26 DIAGNOSIS — K449 Diaphragmatic hernia without obstruction or gangrene: Secondary | ICD-10-CM | POA: Diagnosis not present

## 2019-03-26 DIAGNOSIS — Z79899 Other long term (current) drug therapy: Secondary | ICD-10-CM | POA: Insufficient documentation

## 2019-03-26 DIAGNOSIS — Z8719 Personal history of other diseases of the digestive system: Secondary | ICD-10-CM

## 2019-03-26 DIAGNOSIS — R1031 Right lower quadrant pain: Secondary | ICD-10-CM | POA: Diagnosis not present

## 2019-03-26 DIAGNOSIS — Z7982 Long term (current) use of aspirin: Secondary | ICD-10-CM | POA: Diagnosis not present

## 2019-03-26 HISTORY — DX: Personal history of other diseases of the digestive system: Z87.19

## 2019-03-26 LAB — URINALYSIS, ROUTINE W REFLEX MICROSCOPIC
Bilirubin Urine: NEGATIVE
Glucose, UA: NEGATIVE mg/dL
Ketones, ur: NEGATIVE mg/dL
Nitrite: NEGATIVE
Protein, ur: NEGATIVE mg/dL
RBC / HPF: >50 RBC/hpf — ABNORMAL HIGH (ref 0–5)
Specific Gravity, Urine: 1.017 (ref 1.005–1.030)
pH: 5 (ref 5.0–8.0)

## 2019-03-26 LAB — CBC
HCT: 44.9 % (ref 39.0–52.0)
Hemoglobin: 14.4 g/dL (ref 13.0–17.0)
MCH: 29.1 pg (ref 26.0–34.0)
MCHC: 32.1 g/dL (ref 30.0–36.0)
MCV: 90.7 fL (ref 80.0–100.0)
Platelets: 444 10*3/uL — ABNORMAL HIGH (ref 150–400)
RBC: 4.95 MIL/uL (ref 4.22–5.81)
RDW: 14.1 % (ref 11.5–15.5)
WBC: 12.4 10*3/uL — ABNORMAL HIGH (ref 4.0–10.5)
nRBC: 0 % (ref 0.0–0.2)

## 2019-03-26 LAB — BASIC METABOLIC PANEL
Anion gap: 9 (ref 5–15)
BUN: 25 mg/dL — ABNORMAL HIGH (ref 8–23)
CO2: 24 mmol/L (ref 22–32)
Calcium: 9.8 mg/dL (ref 8.9–10.3)
Chloride: 107 mmol/L (ref 98–111)
Creatinine, Ser: 1.15 mg/dL (ref 0.61–1.24)
GFR calc Af Amer: >60 mL/min (ref >60)
GFR calc non Af Amer: 59 mL/min — ABNORMAL LOW (ref >60)
Glucose, Bld: 130 mg/dL — ABNORMAL HIGH (ref 70–99)
Potassium: 4.6 mmol/L (ref 3.5–5.1)
Sodium: 140 mmol/L (ref 135–145)

## 2019-03-26 MED ORDER — OXYCODONE-ACETAMINOPHEN 5-325 MG PO TABS: 1.0000 | ORAL_TABLET | ORAL | 0 refills | Status: DC | PRN

## 2019-03-26 MED ORDER — SULFAMETHOXAZOLE-TRIMETHOPRIM 400-80 MG PO TABS: 1.0000 | ORAL_TABLET | Freq: Two times a day (BID) | ORAL | 0 refills | Status: DC

## 2019-03-26 NOTE — Telephone Encounter (Signed)
ED CM received call from patient and spouse concerning pain med prescription, CM routed message to Dr. Hart Robinsons EDP. Prescription was e-scribed in to [facility] in [place]. CM updated spouse.

## 2019-03-26 NOTE — Telephone Encounter (Signed)
Patient did not want pain meds but now requesting Will eprescribe 10 percocet

## 2019-03-26 NOTE — ED Provider Notes (Signed)
[facility] EMERGENCY DEPARTMENT Provider Note   CSN: [id number] Arrival date & time: 03/26/19  1058    History   Chief Complaint Chief Complaint  Patient presents with   Flank Pain    HPI Albert Woods is a 84 y.o. male.     HPI  84 year old man with past medical history of hypertension presents today complaining of left flank pain that began last night.  Describes it as similar to previous kidney stone type pain with sharp right-sided flank pain.  He states the pain was present most of the night pain has come and gone today.  Last occurred when he was in route to the ED.  He has stopped.  He states that he want to move around and become comfortable.  He has not noted any frequency with urination.  He was seen at an urgent care center and it was noted that he had some blood in his urine.  He has not noted any visual blood in his urine.  He is not on blood thinners.  Is not had fever, chills, chest pain, cough, and exposure to carbon 19 that he knows of. He did have some nausea with the pain but has not vomited.  He took.  Smiling but has not drank since he has been going to the urgent care.  He presents here she has been seen previously by needed urologist.  Primary care is Dr. Mertha Finders.  He was seen today at an urgent care center in [place].Marland Kitchen  He has not taken any medications and does not take medication except for that evaluation. Past Medical History:  Diagnosis Date   Hypertension     Patient Active Problem List   Diagnosis Date Noted   Chest pain 09/18/2014    Past Surgical History:  Procedure Laterality Date   FOOT SURGERY Right 1950        Home Medications    Prior to Admission medications   Medication Sig Start Date End Date Taking? Authorizing Provider  aspirin EC 81 MG EC tablet Take 1 tablet (81 mg total) by mouth daily. 09/19/14   Lyda Jester M, PA-C  fluticasone (FLONASE) 50 MCG/ACT nasal spray Place 1 spray into both  nostrils daily as needed for allergies or rhinitis.    [provider]  hydrochlorothiazide (HYDRODIURIL) 25 MG tablet Take 25 mg by mouth daily.    [provider]  ibuprofen (ADVIL,MOTRIN) 200 MG tablet Take 200 mg by mouth every 6 (six) hours as needed for mild pain.    [provider]  Multiple Vitamin (MULTIVITAMIN WITH MINERALS) TABS tablet Take 1 tablet by mouth daily.    [provider]  pantoprazole (PROTONIX) 40 MG tablet Take 1 tablet (40 mg total) by mouth daily. 09/19/14   Consuelo Pandy, PA-C    Family History Family History  Problem Relation Age of Onset   Heart attack Brother    Stroke Brother     Social History Social History   Tobacco Use   Smoking status: Never Smoker  Substance Use Topics   Alcohol use: No   Drug use: No     Allergies   Penicillins   Review of Systems Review of Systems  All other systems reviewed and are negative.    Physical Exam Updated Vital Signs BP 138/71 (BP Location: Right Arm)    Pulse 87    Temp 98.7 F (37.1 C) (Oral)    Resp 20    Wt 78  kg    SpO2 98%    BMI 26.54 kg/m   Physical Exam Vitals signs and nursing note reviewed. Exam conducted with a chaperone present.  Constitutional:      General: He is not in acute distress.    Appearance: Normal appearance. He is normal weight. He is not ill-appearing or diaphoretic.  HENT:     Head: Normocephalic and atraumatic.     Right Ear: External ear normal.     Left Ear: External ear normal.     Nose: Nose normal.     Mouth/Throat:     Mouth: Mucous membranes are moist.  Eyes:     Pupils: Pupils are equal, round, and reactive to light.  Neck:     Musculoskeletal: Normal range of motion.  Cardiovascular:     Rate and Rhythm: Regular rhythm.     Pulses: Normal pulses.     Heart sounds: Normal heart sounds.  Pulmonary:     Effort: Pulmonary effort is normal.     Breath sounds: Normal breath sounds.  Abdominal:      General: Abdomen is flat. Bowel sounds are normal. There is no distension.     Palpations: There is no mass.     Tenderness: There is no abdominal tenderness.     Hernia: No hernia is present.  Genitourinary:    Penis: Normal.      Scrotum/Testes: Normal.  Musculoskeletal: Normal range of motion.  Skin:    General: Skin is warm.     Capillary Refill: Capillary refill takes less than 2 seconds.  Neurological:     General: No focal deficit present.     Mental Status: He is alert.  Psychiatric:        Mood and Affect: Mood normal.      ED Treatments / Results  Labs (all labs ordered are listed, but only abnormal results are displayed) Labs Reviewed  URINALYSIS, ROUTINE W REFLEX MICROSCOPIC  CBC  BASIC METABOLIC PANEL    EKG None  Radiology No results found.  Procedures Procedures (including critical care time)  Medications Ordered in ED Medications - No data to display   Initial Impression / Assessment and Plan / ED Course  I have reviewed the triage vital signs and the nursing notes.  Pertinent labs & imaging results that were available during my care of the patient were reviewed by me and considered in my medical decision making (see chart for details).       84 yo male with right flank pain with right 7 mm stone.  Urine with some wbc, but patient without fever, chills and currently pain free.  He is allergic to penicillin.  Discussed care with Dr. Alfonse Spruce.  Plan Bactrim.  Follow-up as arranged with Dr. Jeffie Pollock at 2:00 on Wednesday afternoon in his office.  Patient is informed.  Discussed return precautions including fever, worsening pain, nausea vomiting and patient voices understanding.   Final Clinical Impressions(s) / ED Diagnoses   Final diagnoses:  Right ureteral stone  Urinary tract infection with hematuria, site unspecified    ED Discharge Orders    None       Pattricia Boss, MD 03/26/19 [phone]

## 2019-03-26 NOTE — ED Triage Notes (Signed)
Pt is here for right sided flank pain which began last pm.  Pt with hx of kidney stones. No fever or chills with this.

## 2019-03-26 NOTE — Discharge Instructions (Addendum)
Return to emergency department if you have intractable pain, unable to keep down fluids, or have fever or chills Get your antibiotic prescription filled and take as prescribed.

## 2019-03-26 NOTE — ED Notes (Signed)
Pt was seen at [facility] urgent care PTA.  He had a UA done.  Paperwork with pt: Amber Clear SG:1.025 Ph5 Leukocytes: trace Glucose neg Ketones small Urobilinogen neg Bilirubin neg blood 250+  Pt states that the pain has now subsided and he is currently pain free

## 2019-03-27 LAB — URINE CULTURE: Culture: NO GROWTH

## 2019-03-28 DIAGNOSIS — N202 Calculus of kidney with calculus of ureter: Secondary | ICD-10-CM | POA: Diagnosis not present

## 2019-04-11 DIAGNOSIS — N201 Calculus of ureter: Secondary | ICD-10-CM | POA: Diagnosis not present

## 2019-04-25 DIAGNOSIS — N201 Calculus of ureter: Secondary | ICD-10-CM | POA: Diagnosis not present

## 2019-05-14 DIAGNOSIS — N201 Calculus of ureter: Secondary | ICD-10-CM | POA: Diagnosis not present

## 2019-05-15 ENCOUNTER — Other Ambulatory Visit: Payer: Self-pay | Admitting: Urology

## 2019-06-06 DIAGNOSIS — N202 Calculus of kidney with calculus of ureter: Secondary | ICD-10-CM | POA: Diagnosis not present

## 2019-06-06 DIAGNOSIS — N200 Calculus of kidney: Secondary | ICD-10-CM | POA: Diagnosis not present

## 2019-06-06 DIAGNOSIS — R300 Dysuria: Secondary | ICD-10-CM | POA: Diagnosis not present

## 2019-06-11 ENCOUNTER — Other Ambulatory Visit (HOSPITAL_COMMUNITY)
Admission: RE | Admit: 2019-06-11 | Discharge: 2019-06-11 | Disposition: A | Discharge status: Home / Self Care | Payer: Medicare Other | Source: Ambulatory Visit | Attending: Urology | Admitting: Urology

## 2019-06-11 DIAGNOSIS — Z1159 Encounter for screening for other viral diseases: Secondary | ICD-10-CM | POA: Insufficient documentation

## 2019-06-11 LAB — SARS CORONAVIRUS 2 (TAT 6-24 HRS): SARS Coronavirus 2: NEGATIVE

## 2019-06-12 ENCOUNTER — Encounter (HOSPITAL_COMMUNITY): Payer: Self-pay

## 2019-06-12 NOTE — Patient Instructions (Addendum)
DUE TO COVID-19 ONLY ONE VISITOR IS ALLOWED IN THE HOSPITAL AT THIS TIME   COVID SWAB TESTING COMPLETED ON: June 11 2019 (Must self quarantine after testing. Follow instructions on handout.)   Your procedure is scheduled on: Thursday, June 14, 2019   Surgery Time:  12Noon-1:07PM   Report to [place]  Entrance    Report to admitting at 10:00 AM   Call this number if you have problems the morning of surgery [phone]   Do not eat food or drink liquids :After Midnight.   Brush your teeth the morning of surgery.   Take these medicines the morning of surgery with A SIP OF WATER: None                               You may not have any metal on your body including jewelry, and body piercings              Do not wear lotions, powders, perfumes/cologne, or deodorant                          Men may shave face and neck.   Do not bring valuables to the hospital. Albert Woods.   Contacts, dentures or bridgework may not be worn into surgery.    Patients discharged the day of surgery will not be allowed to drive home.   Name and phone number of your driver: Albert Woods [phone]   Please read over the following fact sheets you were given:  [facility] - Preparing for Surgery Before surgery, you can play an important role.  Because skin is not sterile, your skin needs to be as free of germs as possible.  You can reduce the number of germs on your skin by washing with CHG (chlorahexidine gluconate) soap before surgery.  CHG is an antiseptic cleaner which kills germs and bonds with the skin to continue killing germs even after washing. Please DO NOT use if you have an allergy to CHG or antibacterial soaps.  If your skin becomes reddened/irritated stop using the CHG and inform your nurse when you arrive at Short Stay. Do not shave (including legs and underarms) for at least 48 hours prior to the first CHG shower.  You may  shave your face/neck.  Please follow these instructions carefully:  1.  Shower with CHG Soap the night before surgery and the  morning of surgery.  2.  If you choose to wash your hair, wash your hair first as usual with your normal  shampoo.  3.  After you shampoo, rinse your hair and body thoroughly to remove the shampoo.                             4.  Use CHG as you would any other liquid soap.  You can apply chg directly to the skin and wash.  Gently with a scrungie or clean washcloth.  5.  Apply the CHG Soap to your body ONLY FROM THE NECK DOWN.   Do   not use on face/ open                           Wound or open  sores. Avoid contact with eyes, ears mouth and   genitals (private parts).                       Wash face,  Genitals (private parts) with your normal soap.             6.  Wash thoroughly, paying special attention to the area where your    surgery  will be performed.  7.  Thoroughly rinse your body with warm water from the neck down.  8.  DO NOT shower/wash with your normal soap after using and rinsing off the CHG Soap.                9.  Pat yourself dry with a clean towel.            10.  Wear clean pajamas.            11.  Place clean sheets on your bed the night of your first shower and do not  sleep with pets. Day of Surgery : Do not apply any lotions/deodorants the morning of surgery.  Please wear clean clothes to the hospital/surgery center.  FAILURE TO FOLLOW THESE INSTRUCTIONS MAY RESULT IN THE CANCELLATION OF YOUR SURGERY  PATIENT SIGNATURE_________________________________  NURSE SIGNATURE__________________________________  ________________________________________________________________________

## 2019-06-13 ENCOUNTER — Encounter (HOSPITAL_COMMUNITY): Payer: Self-pay

## 2019-06-13 ENCOUNTER — Encounter (HOSPITAL_COMMUNITY)
Admission: RE | Admit: 2019-06-13 | Discharge: 2019-06-13 | Disposition: A | Discharge status: Home / Self Care | Payer: Medicare Other | Source: Ambulatory Visit | Attending: Urology | Admitting: Urology

## 2019-06-13 ENCOUNTER — Ambulatory Visit (HOSPITAL_COMMUNITY): Payer: Medicare Other | Admitting: Anesthesiology

## 2019-06-13 ENCOUNTER — Ambulatory Visit (HOSPITAL_COMMUNITY): Payer: Medicare Other | Admitting: Physician Assistant

## 2019-06-13 ENCOUNTER — Other Ambulatory Visit: Payer: Self-pay

## 2019-06-13 DIAGNOSIS — I447 Left bundle-branch block, unspecified: Secondary | ICD-10-CM | POA: Insufficient documentation

## 2019-06-13 DIAGNOSIS — N201 Calculus of ureter: Secondary | ICD-10-CM | POA: Diagnosis not present

## 2019-06-13 DIAGNOSIS — Z7982 Long term (current) use of aspirin: Secondary | ICD-10-CM | POA: Insufficient documentation

## 2019-06-13 DIAGNOSIS — I10 Essential (primary) hypertension: Secondary | ICD-10-CM | POA: Insufficient documentation

## 2019-06-13 DIAGNOSIS — Z87442 Personal history of urinary calculi: Secondary | ICD-10-CM | POA: Insufficient documentation

## 2019-06-13 DIAGNOSIS — Z538 Procedure and treatment not carried out for other reasons: Secondary | ICD-10-CM | POA: Diagnosis not present

## 2019-06-13 DIAGNOSIS — Z01818 Encounter for other preprocedural examination: Secondary | ICD-10-CM | POA: Insufficient documentation

## 2019-06-13 DIAGNOSIS — M5136 Other intervertebral disc degeneration, lumbar region: Secondary | ICD-10-CM | POA: Insufficient documentation

## 2019-06-13 HISTORY — DX: Basal cell carcinoma of skin, unspecified: C44.91

## 2019-06-13 HISTORY — DX: Personal history of urinary calculi: Z87.442

## 2019-06-13 HISTORY — DX: Other intervertebral disc degeneration, lumbar region without mention of lumbar back pain or lower extremity pain: M51.369

## 2019-06-13 HISTORY — DX: Other intervertebral disc degeneration, lumbar region: M51.36

## 2019-06-13 LAB — BASIC METABOLIC PANEL
Anion gap: 10 (ref 5–15)
BUN: 20 mg/dL (ref 8–23)
CO2: 24 mmol/L (ref 22–32)
Calcium: 9.7 mg/dL (ref 8.9–10.3)
Chloride: 105 mmol/L (ref 98–111)
Creatinine, Ser: 0.95 mg/dL (ref 0.61–1.24)
GFR calc Af Amer: >60 mL/min (ref >60)
GFR calc non Af Amer: >60 mL/min (ref >60)
Glucose, Bld: 98 mg/dL (ref 70–99)
Potassium: 4.2 mmol/L (ref 3.5–5.1)
Sodium: 139 mmol/L (ref 135–145)

## 2019-06-13 LAB — CBC
HCT: 44 % (ref 39.0–52.0)
Hemoglobin: 13.6 g/dL (ref 13.0–17.0)
MCH: 28.3 pg (ref 26.0–34.0)
MCHC: 30.9 g/dL (ref 30.0–36.0)
MCV: 91.7 fL (ref 80.0–100.0)
Platelets: 438 10*3/uL — ABNORMAL HIGH (ref 150–400)
RBC: 4.8 MIL/uL (ref 4.22–5.81)
RDW: 14.4 % (ref 11.5–15.5)
WBC: 6.5 10*3/uL (ref 4.0–10.5)
nRBC: 0 % (ref 0.0–0.2)

## 2019-06-13 NOTE — Progress Notes (Signed)
Anesthesia Chart Review   Case: [id number] Date/Time: 06/14/19 1145   Procedure: CYSTOSCOPY RIGHT RETROGRADE URETEROSCOPY HOLMIUM LASER POSSIBLW STENT PLACEMENT (Right )   Anesthesia type: General   Pre-op diagnosis: RIGHT URETERAL STONE   Location: [facility] / [facility]   Surgeon: Irine Seal, MD      DISCUSSION:84 y.o. never smoker with h/o HTN, LBBB, hiatal hernia, right ureteral stone scheduled for above procedure 06/14/2019 with Dr. Irine Seal.   Pt admitted 08/2014 for atypical chest pain and LBBB on EKG.  Per cardiology consult note NST low risk for ischemia, EF normal at 55%.  Advised to follow up with Dr. Sallyanne Kuster as needed.    Anticipate pt can proceed with planned procedure barring acute status change.   VS: BP (!) 146/80 (BP Location: Right Arm)   Pulse 76   Temp 37 C (Oral)   Resp 18   Ht 5\' 7"  (1.702 m)   Wt 70.5 kg   BMI 24.34 kg/m   PROVIDERS: Janith Lima, MD is PCP    LABS: Labs reviewed: Acceptable for surgery. (all labs ordered are listed, but only abnormal results are displayed)  Labs Reviewed  CBC - Abnormal; Notable for the following components:      Result Value   Platelets 438 (*)    All other components within normal limits  BASIC METABOLIC PANEL     IMAGES:   EKG: 06/13/2019 Rate 76 bpm Sinus rhythm with 1st degree A-V block Left axis deviation Left bundle branch block  CV:  NST 09/19/14 FINDINGS:  Perfusion: No decreased activity in the left ventricle on stress  imaging to suggest reversible ischemia or infarction.  Wall Motion: Normal left ventricular wall motion. No left  ventricular dilation.  Left Ventricular Ejection Fraction: 55 %  End diastolic volume 710 ml  End systolic volume 48 ml  IMPRESSION:  1. No reversible ischemia or infarction.  2. Normal left ventricular wall motion.  3. Left ventricular ejection fraction 55%  4. Low-risk stress test findings*. Past Medical History:  Diagnosis Date  . Basal cell  carcinoma   . DDD (degenerative disc disease), lumbar   . History of hiatal hernia 03/26/2019   Large  . History of kidney stones   . Hypertension   . LBBB (left bundle branch block) 2015    Past Surgical History:  Procedure Laterality Date  . FOOT SURGERY Right 1950    MEDICATIONS: . aspirin EC 81 MG EC tablet  . ciprofloxacin (CIPRO) 500 MG tablet  . ibuprofen (ADVIL,MOTRIN) 200 MG tablet  . Multiple Vitamin (MULTIVITAMIN WITH MINERALS) TABS tablet  . oxyCODONE-acetaminophen (PERCOCET/ROXICET) 5-325 MG tablet  . pantoprazole (PROTONIX) 40 MG tablet  . sulfamethoxazole-trimethoprim (BACTRIM) 400-80 MG tablet  . tamsulosin (FLOMAX) 0.4 MG CAPS capsule   No current facility-administered medications for this encounter.     Maia Plan [facility] Pre-Surgical Testing [phone] 06/13/19  3:21 PM

## 2019-06-13 NOTE — H&P (Signed)
CC: I have ureteral stone.  HPI: Albert Woods is a 84 year-old male established patient who is here for ureteral stone.  622/20: Albert Woods returns today for follow up. KUB at prior OV continued to show right distal ureteral stone without progression. Today he denies passing a stone in the interim. He did have an episode of mild flank pain, which radiated to his RLQ for approximatly 2 days last week. This has now subsided. He denies any difficulties voiding or exacerbation of voiding symptoms. He denies gross hematuria, fevers, chills, nausea, or vomiting. He is not on blood thinners. He denies cardiac hx. He is not diabetic.   04/25/19: Albert Woods returns today for follow up. He denies seeing a stone pass. He continues to feel well and remains asymptomatic. No intermittent or current complaints of flank or abdominal pain. He denies difficulties or exacerbation of voiding. No hematuria, fevers, or chills.   04/11/19: Albert Woods has below noted history. He returns today for follow up. He denies seeing a stone pass. He denies any current flank or abdominal pain. He denies exacerbation of voiding symptoms or difficulties voiding. He denies dysuria, gross hematuria, fevers, chills, nausea, or vomiting.   03/28/19: Albert Woods is an 84 yo WM who had the onset Monday of severe right flank pain. He went to the ED at [facility] but the pain had abated. he had some hematuria on the UA. he had a CT that showed a 4.31m RUVJ stone and he thinks he passed it in the ED. He also had small bilateral renal stones. He has been symptom free since. He has some chronic back pain with spinal stenosis. He is voiding without significant complaints.   The problem is on the right side. He is not currently having flank pain, back pain, groin pain, nausea, vomiting, fever or chills. He has not caught a stone in his urine strainer since his symptoms began.   He has never had surgical treatment for calculi in the past.     ALLERGIES: Penicillins     MEDICATIONS: Multivitamins tablet Oral     GU PSH: None     PSH Notes: Shoulder Surgery, Foot Surgery   NON-GU PSH: Foot surgery (unspecified), Right     GU PMH: Ureteral calculus - 04/25/2019, - 04/11/2019, The stone hasn't passed. I discussed MET, URS and ESWL and have sent tamsulosin and tramadol for MET and he will return in 2 weeks with a KUB. , - 03/28/2019 Microscopic hematuria - 03/28/2019 ED due to arterial insufficiency, Erectile dysfunction due to arterial insufficiency - 219-Feb-2016Renal calculus, Kidney stone on left side - 2Feb 19, 2016BPH w/o LUTS, Benign prostatic hypertrophy without lower urinary tract symptoms - 219-Feb-2014Encounter for Prostate Cancer screening, Prostate cancer screening - 22014-02-19History of urolithiasis, Nephrolithiasis - 2February 19, 2014Obstructive and reflux uropathy, Unspec, Obstructive uropathy - 2Feb 19, 2014Prostate, Neoplasm of uncertain behavior, Neoplasm of uncertain behavior of prostate - 2February 19, 2014     PMH Notes:  2007-08-25 10:15:59 - Note: Arthritis   NON-GU PMH: Encounter for general adult medical examination without abnormal findings, Encounter for preventive health examination - 2Feb 19, 2016Personal history of other diseases of the musculoskeletal system and connective tissue, History of spinal stenosis - 219-Feb-2016Personal history of other diseases of the circulatory system, History of hypertension - 202/19/2014Personal history of other endocrine, nutritional and metabolic disease, History of hypercholesterolemia - 22014/02/19   FAMILY HISTORY: Death of family member - Father, Mother Heart Attack - Brother nephrolithiasis - Daughter, Father  Prostate Cancer - Brother stroke - Brother    Notes: 1 son; 1 daughter   SOCIAL HISTORY: Marital Status: Married Preferred Language: English; Ethnicity: Not Hispanic Or Latino; Race: White Current Smoking Status: Patient has never smoked.   Tobacco Use Assessment Completed: Used Tobacco in last 30 days? Does not use smokeless tobacco. Has never drank.   Drinks 2 caffeinated drinks per day.     Notes: Former smoker, Alcohol Use, Tobacco Use, Occupation:, Caffeine Use, Marital History - Currently Married   REVIEW OF SYSTEMS:    GU Review Male:   Patient denies have to strain to urinate , burning/ pain with urination, erection problems, leakage of urine, frequent urination, stream starts and stops, hard to postpone urination, get up at night to urinate, penile pain, and trouble starting your stream.  Gastrointestinal (Upper):   Patient denies nausea, vomiting, and indigestion/ heartburn.  Gastrointestinal (Lower):   Patient denies diarrhea and constipation.  Constitutional:   Patient denies fever, night sweats, weight loss, and fatigue.  Skin:   Patient denies skin rash/ lesion and itching.  Eyes:   Patient denies blurred vision and double vision.  Ears/ Nose/ Throat:   Patient denies sore throat and sinus problems.  Hematologic/Lymphatic:   Patient denies swollen glands and easy bruising.  Cardiovascular:   Patient denies leg swelling and chest pains.  Respiratory:   Patient denies cough and shortness of breath.  Endocrine:   Patient denies excessive thirst.  Musculoskeletal:   Patient denies back pain and joint pain.  Neurological:   Patient denies headaches and dizziness.  Psychologic:   Patient denies depression and anxiety.   VITAL SIGNS:      05/14/2019 01:57 PM  Weight 160 lb / 72.57 kg  Height 67 in / 170.18 cm  BP 150/84 mmHg  Pulse 73 /min  Temperature 98.4 F / 36.8 C  BMI 25.1 kg/m   MULTI-SYSTEM PHYSICAL EXAMINATION:    Constitutional: Well-nourished. No physical deformities. Normally developed. Good grooming.  Respiratory: Normal breath sounds. No labored breathing, no use of accessory muscles.   Cardiovascular: Regular rate and rhythm. No murmur, there is a gallop.Marland Kitchen   Neurologic / Psychiatric: Oriented to time, oriented to place, oriented to person. No depression, no anxiety, no agitation.  Gastrointestinal: No mass,  no tenderness, no rigidity, non obese abdomen. No CVAT.   Musculoskeletal: Normal gait and station of head and neck.     PAST DATA REVIEWED:  Source Of History:  Patient  Records Review:   Previous Patient Records  Urine Test Review:   Urinalysis, Urine Culture  Urodynamics Review:   Review Bladder Scan  X-Ray Review: KUB: Reviewed Films.  Renal Ultrasound: Reviewed Films.     02/25/14 12/06/11 06/18/10 11/18/09 08/06/08 08/02/06 06/28/05 08/24/04  PSA  Total PSA 2.30  2.18  2.11  2.43  1.67  1.40  1.24  1.12     06/28/05 08/24/04  Hormones  Testosterone, Total 4.83  4.35     PROCEDURES:         KUB - 74018  A single view of the abdomen is obtained. Renal shadows are again not well visualized due to overlying bowel gas pattern. There continues to be an approximatly 3 X 5 mm opacity noted along the course of the right distal ureter. This appears consistent with calculus noted on prior CT and KUB imaging. It does not appear to have progressed. Prominent, but normal appearing bowel gas pattern. Scoliosis with severe lumbar DDD.  Patient confirmed No Neulasta OnPro Device.           Urinalysis w/Scope Dipstick Dipstick Cont'd Micro  Color: Yellow Bilirubin: Neg mg/dL WBC/hpf: NS (Not Seen)  Appearance: Clear Ketones: Neg mg/dL RBC/hpf: 3 - 10/hpf  Specific Gravity: 1.020 Blood: 1+ ery/uL Bacteria: NS (Not Seen)  pH: 7.0 Protein: Trace mg/dL Cystals: NS (Not Seen)  Glucose: Neg mg/dL Urobilinogen: 0.2 mg/dL Casts: NS (Not Seen)    Nitrites: Neg Trichomonas: Not Present    Leukocyte Esterase: Neg leu/uL Mucous: Not Present      Epithelial Cells: NS (Not Seen)      Yeast: NS (Not Seen)      Sperm: Not Present    ASSESSMENT:      ICD-10 Details  1 GU:   Ureteral calculus - N20.1    PLAN:           Orders Labs Urine Culture          Schedule Return Visit/Planned Activity: Other See Visit Notes          Document Letter(s):  Created for Patient: Clinical Summary          Notes:   I will send urine for culture today. On KUB imaging, there continues to be an approximatly 3 X 5 mm opacity noted along the course of the right distal ureter. This appears consistent with calculus noted on prior CT and KUB imaging. It does not appear to have progressed. Given lack of progression, we reviewed treatment with URS in detail today. For ureteroscopy I described the risks which include general anesthetic complications, bleeding, infection, damage to contiguous structures, positioning injury, ureteral stricture, ureteral avulsion, ureteral injury, need for ureteral stent, inability to perform ureteroscopy, need for an interval procedure, inability to clear stone burden, stent discomfort and pain. He voiced understanding. I will review again with his urologist and follow up plan will be pending these recommendations. In the interim, I encouraged he continue to hydrate well. Strict return precuations reviewed for fevers or progressive pain, nausea, or vomiting.

## 2019-06-13 NOTE — Anesthesia Preprocedure Evaluation (Addendum)
Anesthesia Evaluation  Patient identified by MRN, date of birth, ID band Patient awake    Reviewed: Allergy & Precautions, NPO status , Patient's Chart, lab work & pertinent test results  Airway Mallampati: II  TM Distance: >3 FB Neck ROM: Full    Dental no notable dental hx.    Pulmonary neg pulmonary ROS,    Pulmonary exam normal breath sounds clear to auscultation       Cardiovascular hypertension, Pt. on medications Normal cardiovascular exam+ dysrhythmias  Rhythm:Regular Rate:Normal     Neuro/Psych negative neurological ROS  negative psych ROS   GI/Hepatic negative GI ROS, Neg liver ROS,   Endo/Other  negative endocrine ROS  Renal/GU negative Renal ROS  negative genitourinary   Musculoskeletal  (+) Arthritis , Osteoarthritis,    Abdominal   Peds negative pediatric ROS (+)  Hematology negative hematology ROS (+)   Anesthesia Other Findings   Reproductive/Obstetrics negative OB ROS                            Anesthesia Physical Anesthesia Plan  ASA: II  Anesthesia Plan: General   Post-op Pain Management:    Induction: Intravenous  PONV Risk Score and Plan: 2 and Ondansetron, Midazolam and Treatment may vary due to age or medical condition  Airway Management Planned: LMA  Additional Equipment:   Intra-op Plan:   Post-operative Plan: Extubation in OR  Informed Consent: I have reviewed the patients History and Physical, chart, labs and discussed the procedure including the risks, benefits and alternatives for the proposed anesthesia with the patient or authorized representative who has indicated his/her understanding and acceptance.     Dental advisory given  Plan Discussed with: CRNA  Anesthesia Plan Comments: (See PAT note 06/13/2019, Konrad Felix, PA-C)       Anesthesia Quick Evaluation

## 2019-06-14 ENCOUNTER — Ambulatory Visit (HOSPITAL_COMMUNITY)
Admission: RE | Admit: 2019-06-14 | Discharge: 2019-06-14 | Disposition: A | Discharge status: Home / Self Care | Payer: Medicare Other | Attending: Urology | Admitting: Urology

## 2019-06-14 ENCOUNTER — Encounter (HOSPITAL_COMMUNITY): Payer: Self-pay | Admitting: *Deleted

## 2019-06-14 ENCOUNTER — Encounter (HOSPITAL_COMMUNITY)
Admission: RE | Disposition: A | Discharge status: Home / Self Care | Payer: Self-pay | Source: Home / Self Care | Attending: Urology

## 2019-06-14 ENCOUNTER — Ambulatory Visit (HOSPITAL_COMMUNITY): Payer: Medicare Other

## 2019-06-14 DIAGNOSIS — N201 Calculus of ureter: Secondary | ICD-10-CM | POA: Insufficient documentation

## 2019-06-14 DIAGNOSIS — Z538 Procedure and treatment not carried out for other reasons: Secondary | ICD-10-CM | POA: Diagnosis not present

## 2019-06-14 DIAGNOSIS — N200 Calculus of kidney: Secondary | ICD-10-CM | POA: Diagnosis not present

## 2019-06-14 LAB — URINALYSIS, ROUTINE W REFLEX MICROSCOPIC
Bilirubin Urine: NEGATIVE
Glucose, UA: NEGATIVE mg/dL
Hgb urine dipstick: NEGATIVE
Ketones, ur: 5 mg/dL — AB
Leukocytes,Ua: NEGATIVE
Nitrite: NEGATIVE
Protein, ur: NEGATIVE mg/dL
Specific Gravity, Urine: 1.016 (ref 1.005–1.030)
pH: 7 (ref 5.0–8.0)

## 2019-06-14 SURGERY — CYSTOSCOPY/URETEROSCOPY/HOLMIUM LASER/STENT PLACEMENT
Anesthesia: General | Laterality: Right

## 2019-06-14 MED ORDER — LACTATED RINGERS IV SOLN
INTRAVENOUS | Status: DC
  Administered 2019-06-14: 10:00:00 via INTRAVENOUS

## 2019-06-14 MED ORDER — CIPROFLOXACIN HCL 500 MG PO TABS: 500.0000 mg | ORAL_TABLET | Freq: Two times a day (BID) | ORAL | 0 refills | Status: DC

## 2019-06-14 MED ORDER — FENTANYL CITRATE (PF) 100 MCG/2ML IJ SOLN
INTRAMUSCULAR | Status: AC
  Filled 2019-06-14: qty 2

## 2019-06-14 MED ORDER — CIPROFLOXACIN IN D5W 400 MG/200ML IV SOLN
400.0000 mg | INTRAVENOUS | Status: DC
  Filled 2019-06-14: qty 200

## 2019-06-14 NOTE — Progress Notes (Signed)
Pt surgery cancelled per Dr. Jeffie Pollock.

## 2019-06-14 NOTE — Interval H&P Note (Signed)
History and Physical Interval Note:  He still has some flank pain and irritative voiding symptoms, but I will get a KUB prior to proceeding since he has not had a film since 05/14/19.   06/14/2019 1:07 PM  Albert Woods  has presented today for surgery, with the diagnosis of RIGHT URETERAL STONE.  The various methods of treatment have been discussed with the patient and family. After consideration of risks, benefits and other options for treatment, the patient has consented to  Procedure(s): CYSTOSCOPY RIGHT RETROGRADE URETEROSCOPY HOLMIUM LASER POSSIBLW STENT PLACEMENT (Right) as a surgical intervention.  The patient's history has been reviewed, patient examined, no change in status, stable for surgery.  I have reviewed the patient's chart and labs.  Questions were answered to the patient's satisfaction.     Irine Seal

## 2019-06-14 NOTE — Transfer of Care (Signed)
Case cancelled

## 2019-06-14 NOTE — Discharge Instructions (Signed)
Urinary Tract Infection, Adult A urinary tract infection (UTI) is an infection of any part of the urinary tract. The urinary tract includes:  The kidneys.  The ureters.  The bladder.  The urethra. These organs make, store, and get rid of pee (urine) in the body. What are the causes? This is caused by germs (bacteria) in your genital area. These germs grow and cause swelling (inflammation) of your urinary tract. What increases the risk? You are more likely to develop this condition if:  You have a small, thin tube (catheter) to drain pee.  You cannot control when you pee or poop (incontinence).  You are male, and: ? You use these methods to prevent pregnancy: ? A medicine that kills sperm (spermicide). ? A device that blocks sperm (diaphragm). ? You have low levels of a male hormone (estrogen). ? You are pregnant.  You have genes that add to your risk.  You are sexually active.  You take antibiotic medicines.  You have trouble peeing because of: ? A prostate that is bigger than normal, if you are male. ? A blockage in the part of your body that drains pee from the bladder (urethra). ? A kidney stone. ? A nerve condition that affects your bladder (neurogenic bladder). ? Not getting enough to drink. ? Not peeing often enough.  You have other conditions, such as: ? Diabetes. ? A weak disease-fighting system (immune system). ? Sickle cell disease. ? Gout. ? Injury of the spine. What are the signs or symptoms? Symptoms of this condition include:  Needing to pee right away (urgently).  Peeing often.  Peeing small amounts often.  Pain or burning when peeing.  Blood in the pee.  Pee that smells bad or not like normal.  Trouble peeing.  Pee that is cloudy.  Fluid coming from the vagina, if you are male.  Pain in the belly or lower back. Other symptoms include:  Throwing up (vomiting).  No urge to eat.  Feeling mixed up (confused).  Being tired  and grouchy (irritable).  A fever.  Watery poop (diarrhea). How is this treated? This condition may be treated with:  Antibiotic medicine.  Other medicines.  Drinking enough water. Follow these instructions at home:  Medicines  Take over-the-counter and prescription medicines only as told by your doctor.  If you were prescribed an antibiotic medicine, take it as told by your doctor. Do not stop taking it even if you start to feel better. General instructions  Make sure you: ? Pee until your bladder is empty. ? Do not hold pee for a long time. ? Empty your bladder after sex. ? Wipe from front to back after pooping if you are a male. Use each tissue one time when you wipe.  Drink enough fluid to keep your pee pale yellow.  Keep all follow-up visits as told by your doctor. This is important. Contact a doctor if:  You do not get better after 1-2 days.  Your symptoms go away and then come back. Get help right away if:  You have very bad back pain.  You have very bad pain in your lower belly.  You have a fever.  You are sick to your stomach (nauseous).  You are throwing up. Summary  A urinary tract infection (UTI) is an infection of any part of the urinary tract.  This condition is caused by germs in your genital area.  There are many risk factors for a UTI. These include having a small, thin   tube to drain pee and not being able to control when you pee or poop.  Treatment includes antibiotic medicines for germs.  Drink enough fluid to keep your pee pale yellow. This information is not intended to replace advice given to you by your health care provider. Make sure you discuss any questions you have with your health care provider. Document Released: 04/26/2008 Document Revised: 10/26/2018 Document Reviewed: 05/18/2018 Elsevier Patient Education  2020 [employer]  

## 2019-06-15 LAB — URINE CULTURE: Culture: NO GROWTH

## 2019-06-21 DIAGNOSIS — N201 Calculus of ureter: Secondary | ICD-10-CM | POA: Diagnosis not present

## 2019-06-21 DIAGNOSIS — R300 Dysuria: Secondary | ICD-10-CM | POA: Diagnosis not present

## 2019-06-22 NOTE — Progress Notes (Signed)
Albert Woods was minimally symptomatic so I obtained a KUB preop and the stone had passed.  His UA was clear as well. The procedure was cancelled.

## 2019-07-05 DIAGNOSIS — N201 Calculus of ureter: Secondary | ICD-10-CM | POA: Diagnosis not present

## 2019-07-05 DIAGNOSIS — R300 Dysuria: Secondary | ICD-10-CM | POA: Diagnosis not present

## 2019-07-10 DIAGNOSIS — Z85828 Personal history of other malignant neoplasm of skin: Secondary | ICD-10-CM | POA: Diagnosis not present

## 2019-07-10 DIAGNOSIS — L821 Other seborrheic keratosis: Secondary | ICD-10-CM | POA: Diagnosis not present

## 2019-07-10 DIAGNOSIS — L570 Actinic keratosis: Secondary | ICD-10-CM | POA: Diagnosis not present

## 2019-07-10 DIAGNOSIS — D1801 Hemangioma of skin and subcutaneous tissue: Secondary | ICD-10-CM | POA: Diagnosis not present

## 2019-07-10 DIAGNOSIS — D225 Melanocytic nevi of trunk: Secondary | ICD-10-CM | POA: Diagnosis not present

## 2019-08-13 DIAGNOSIS — I10 Essential (primary) hypertension: Secondary | ICD-10-CM | POA: Diagnosis not present

## 2019-08-13 DIAGNOSIS — Z1389 Encounter for screening for other disorder: Secondary | ICD-10-CM | POA: Diagnosis not present

## 2019-08-13 DIAGNOSIS — I447 Left bundle-branch block, unspecified: Secondary | ICD-10-CM | POA: Diagnosis not present

## 2019-08-13 DIAGNOSIS — N400 Enlarged prostate without lower urinary tract symptoms: Secondary | ICD-10-CM | POA: Diagnosis not present

## 2019-08-13 DIAGNOSIS — M4807 Spinal stenosis, lumbosacral region: Secondary | ICD-10-CM | POA: Diagnosis not present

## 2019-08-13 DIAGNOSIS — Z0001 Encounter for general adult medical examination with abnormal findings: Secondary | ICD-10-CM | POA: Diagnosis not present

## 2019-08-13 DIAGNOSIS — H9190 Unspecified hearing loss, unspecified ear: Secondary | ICD-10-CM | POA: Diagnosis not present

## 2019-08-13 DIAGNOSIS — E785 Hyperlipidemia, unspecified: Secondary | ICD-10-CM | POA: Diagnosis not present

## 2019-08-13 DIAGNOSIS — Z87442 Personal history of urinary calculi: Secondary | ICD-10-CM | POA: Diagnosis not present

## 2019-08-13 DIAGNOSIS — L570 Actinic keratosis: Secondary | ICD-10-CM | POA: Diagnosis not present

## 2019-08-13 DIAGNOSIS — Z87438 Personal history of other diseases of male genital organs: Secondary | ICD-10-CM | POA: Diagnosis not present

## 2019-09-10 DIAGNOSIS — N200 Calculus of kidney: Secondary | ICD-10-CM | POA: Diagnosis not present

## 2019-12-14 DIAGNOSIS — J309 Allergic rhinitis, unspecified: Secondary | ICD-10-CM | POA: Diagnosis not present

## 2019-12-21 DIAGNOSIS — Z20828 Contact with and (suspected) exposure to other viral communicable diseases: Secondary | ICD-10-CM | POA: Diagnosis not present

## 2020-02-12 DIAGNOSIS — H1789 Other corneal scars and opacities: Secondary | ICD-10-CM | POA: Diagnosis not present

## 2020-02-12 DIAGNOSIS — H353120 Nonexudative age-related macular degeneration, left eye, stage unspecified: Secondary | ICD-10-CM | POA: Diagnosis not present

## 2020-02-12 DIAGNOSIS — H2513 Age-related nuclear cataract, bilateral: Secondary | ICD-10-CM | POA: Diagnosis not present

## 2020-05-30 DIAGNOSIS — Z01818 Encounter for other preprocedural examination: Secondary | ICD-10-CM | POA: Diagnosis not present

## 2020-05-30 DIAGNOSIS — H2512 Age-related nuclear cataract, left eye: Secondary | ICD-10-CM | POA: Diagnosis not present

## 2020-06-17 DIAGNOSIS — H9190 Unspecified hearing loss, unspecified ear: Secondary | ICD-10-CM | POA: Diagnosis not present

## 2020-06-17 DIAGNOSIS — M1990 Unspecified osteoarthritis, unspecified site: Secondary | ICD-10-CM | POA: Diagnosis not present

## 2020-06-17 DIAGNOSIS — H259 Unspecified age-related cataract: Secondary | ICD-10-CM | POA: Diagnosis not present

## 2020-06-17 DIAGNOSIS — H2512 Age-related nuclear cataract, left eye: Secondary | ICD-10-CM | POA: Diagnosis not present

## 2020-06-24 DIAGNOSIS — Z23 Encounter for immunization: Secondary | ICD-10-CM | POA: Diagnosis not present

## 2020-06-24 DIAGNOSIS — H2511 Age-related nuclear cataract, right eye: Secondary | ICD-10-CM | POA: Diagnosis not present

## 2020-06-24 DIAGNOSIS — Z01818 Encounter for other preprocedural examination: Secondary | ICD-10-CM | POA: Diagnosis not present

## 2020-07-09 DIAGNOSIS — L821 Other seborrheic keratosis: Secondary | ICD-10-CM | POA: Diagnosis not present

## 2020-07-09 DIAGNOSIS — D2261 Melanocytic nevi of right upper limb, including shoulder: Secondary | ICD-10-CM | POA: Diagnosis not present

## 2020-07-09 DIAGNOSIS — Z85828 Personal history of other malignant neoplasm of skin: Secondary | ICD-10-CM | POA: Diagnosis not present

## 2020-07-09 DIAGNOSIS — D692 Other nonthrombocytopenic purpura: Secondary | ICD-10-CM | POA: Diagnosis not present

## 2020-07-09 DIAGNOSIS — L570 Actinic keratosis: Secondary | ICD-10-CM | POA: Diagnosis not present

## 2020-07-09 DIAGNOSIS — D2262 Melanocytic nevi of left upper limb, including shoulder: Secondary | ICD-10-CM | POA: Diagnosis not present

## 2020-07-15 DIAGNOSIS — H259 Unspecified age-related cataract: Secondary | ICD-10-CM | POA: Diagnosis not present

## 2020-07-15 DIAGNOSIS — H52223 Regular astigmatism, bilateral: Secondary | ICD-10-CM | POA: Diagnosis not present

## 2020-07-15 DIAGNOSIS — H26492 Other secondary cataract, left eye: Secondary | ICD-10-CM | POA: Diagnosis not present

## 2020-07-15 DIAGNOSIS — H2512 Age-related nuclear cataract, left eye: Secondary | ICD-10-CM | POA: Diagnosis not present

## 2020-07-15 DIAGNOSIS — H9190 Unspecified hearing loss, unspecified ear: Secondary | ICD-10-CM | POA: Diagnosis not present

## 2020-07-15 DIAGNOSIS — M1990 Unspecified osteoarthritis, unspecified site: Secondary | ICD-10-CM | POA: Diagnosis not present

## 2020-07-15 DIAGNOSIS — H2511 Age-related nuclear cataract, right eye: Secondary | ICD-10-CM | POA: Diagnosis not present

## 2020-08-26 DIAGNOSIS — R269 Unspecified abnormalities of gait and mobility: Secondary | ICD-10-CM | POA: Diagnosis not present

## 2020-08-26 DIAGNOSIS — I10 Essential (primary) hypertension: Secondary | ICD-10-CM | POA: Diagnosis not present

## 2020-08-26 DIAGNOSIS — Z23 Encounter for immunization: Secondary | ICD-10-CM | POA: Diagnosis not present

## 2020-08-26 DIAGNOSIS — H9190 Unspecified hearing loss, unspecified ear: Secondary | ICD-10-CM | POA: Diagnosis not present

## 2020-08-26 DIAGNOSIS — N400 Enlarged prostate without lower urinary tract symptoms: Secondary | ICD-10-CM | POA: Diagnosis not present

## 2020-08-26 DIAGNOSIS — Z0001 Encounter for general adult medical examination with abnormal findings: Secondary | ICD-10-CM | POA: Diagnosis not present

## 2020-08-26 DIAGNOSIS — E785 Hyperlipidemia, unspecified: Secondary | ICD-10-CM | POA: Diagnosis not present

## 2020-08-26 DIAGNOSIS — Z87442 Personal history of urinary calculi: Secondary | ICD-10-CM | POA: Diagnosis not present

## 2020-08-26 DIAGNOSIS — M4807 Spinal stenosis, lumbosacral region: Secondary | ICD-10-CM | POA: Diagnosis not present

## 2020-08-26 DIAGNOSIS — Z87438 Personal history of other diseases of male genital organs: Secondary | ICD-10-CM | POA: Diagnosis not present

## 2020-08-26 DIAGNOSIS — I447 Left bundle-branch block, unspecified: Secondary | ICD-10-CM | POA: Diagnosis not present

## 2020-08-26 DIAGNOSIS — E559 Vitamin D deficiency, unspecified: Secondary | ICD-10-CM | POA: Diagnosis not present

## 2020-08-26 DIAGNOSIS — I700 Atherosclerosis of aorta: Secondary | ICD-10-CM | POA: Diagnosis not present

## 2020-09-02 ENCOUNTER — Other Ambulatory Visit: Payer: Self-pay

## 2020-09-02 NOTE — Patient Outreach (Signed)
[facility]) Care Management  09/02/2020  LATEEF JUNCAJ 09/03/1935 [id number]   Telephone assessment:  New referral from MD office of Dr. Josetta Huddle.  Reviewed office notes. Reason for referral- frequent falls  Placed call to patient with no answer and no machine.   PLAN: will mail an unsuccessful outreach letter and attempt again in 3-4 business days.  Tomasa Rand, RN, BSN, CEN [facility] [employer] [phone]

## 2020-09-08 ENCOUNTER — Ambulatory Visit: Payer: Self-pay

## 2020-09-15 ENCOUNTER — Other Ambulatory Visit: Payer: Self-pay

## 2020-09-15 NOTE — Patient Outreach (Signed)
[facility]) Care Management  09/15/2020  Albert Woods 07-Nov-1935 [id number]   Referral for frequent falls from MD office:  Placed call to patient today with no answer and no voicemail.  PLAN: will attempt again in 3 business days.  Note this attempt was 1 week late due to this case manager on PAL.  Tomasa Rand, RN, BSN, CEN [facility] [employer] [phone]

## 2020-09-16 DIAGNOSIS — J3489 Other specified disorders of nose and nasal sinuses: Secondary | ICD-10-CM | POA: Diagnosis not present

## 2020-09-16 DIAGNOSIS — Z20828 Contact with and (suspected) exposure to other viral communicable diseases: Secondary | ICD-10-CM | POA: Diagnosis not present

## 2020-09-18 ENCOUNTER — Other Ambulatory Visit: Payer: Self-pay

## 2020-09-18 NOTE — Patient Outreach (Signed)
[facility]) Care Management  09/18/2020  Albert Woods 1935-05-11 [id number]   Telephone assessment/ case closure:  Placed 3rd call to patient and wife answered. Reviewed Hippa.  Reviewed reason for call. Wife reports that they received my letter and do not feel interested in program. Reviewed referral from MD office and the reason. Wife reports husbands back gives away and he has spinal stenosis and does not feel anything would be helpful . Reviewed use of assistive devices and wife declines. Reports patient has good days and bad days. States that patient is not up for surgery due to age.  Reports constant pain and patient refused to take anything other than advil.   Reviewed with wife willingness to help in any way and she declines.  Reviewed with wife I would close case as not interested but I am available if she and her husband change their minds. She was thankful for call.   PLAN: close case as refused needs.  Will send letter to MD to notify.   Tomasa Rand, RN, BSN, CEN [facility] [employer] [phone]

## 2020-09-26 DIAGNOSIS — Z23 Encounter for immunization: Secondary | ICD-10-CM | POA: Diagnosis not present

## 2020-11-18 DIAGNOSIS — Z20828 Contact with and (suspected) exposure to other viral communicable diseases: Secondary | ICD-10-CM | POA: Diagnosis not present

## 2020-11-18 DIAGNOSIS — J069 Acute upper respiratory infection, unspecified: Secondary | ICD-10-CM | POA: Diagnosis not present

## 2020-11-27 DIAGNOSIS — N200 Calculus of kidney: Secondary | ICD-10-CM | POA: Diagnosis not present

## 2020-12-08 IMAGING — CT CT RENAL STONE PROTOCOL
2 of 4 series · 16 of 46 positions shown, 18 images · non-contrast
Comparison: None.

CLINICAL DATA: Acute right flank pain.

EXAM:
CT ABDOMEN AND PELVIS WITHOUT CONTRAST
TECHNIQUE: Multidetector CT imaging of the abdomen and pelvis was performed
following the standard protocol without IV contrast.

[Series 3: renal stone 5.0 · axial · 0.79mm/px · z∈[+798,+1188]mm · 13 of 86 slices shown, 15 images]
[im 4/86  soft-tissue]
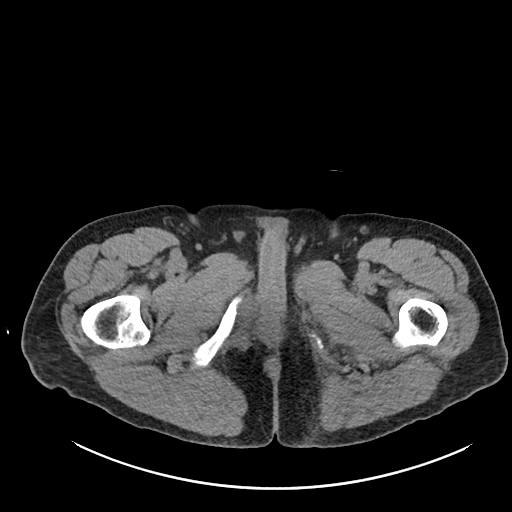
[im 4/86  bone]
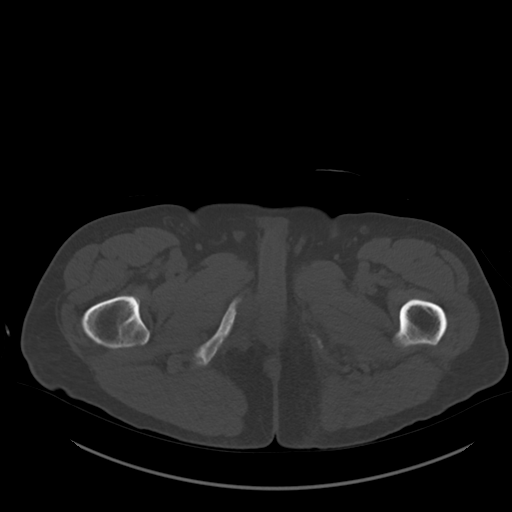
[im 11/86  soft-tissue]
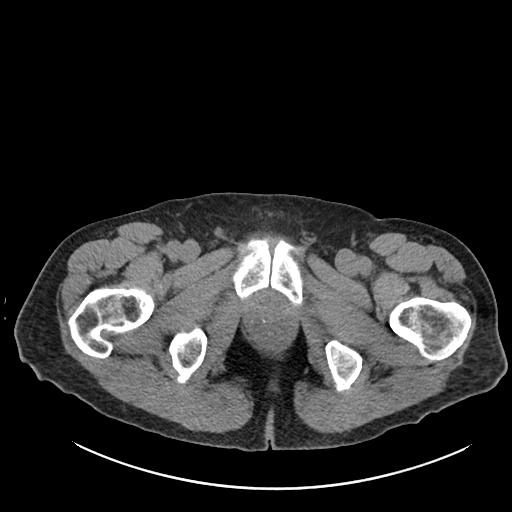
[im 18/86  soft-tissue]
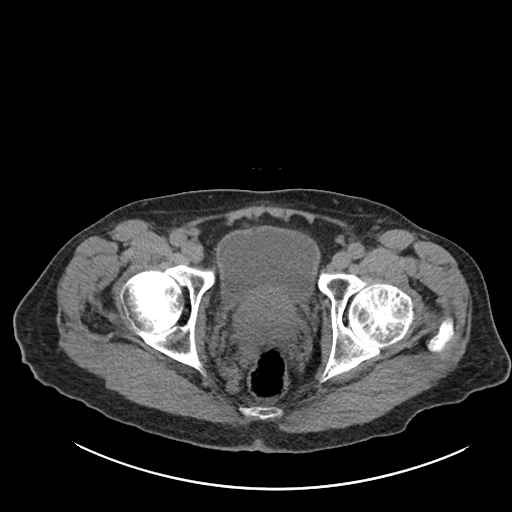
[im 25/86  soft-tissue]
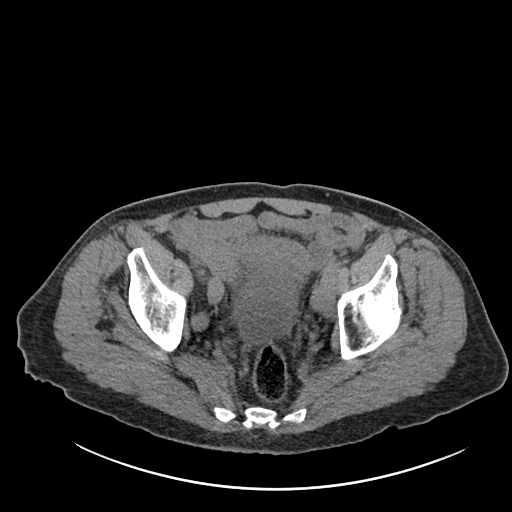
[im 29/86  soft-tissue]
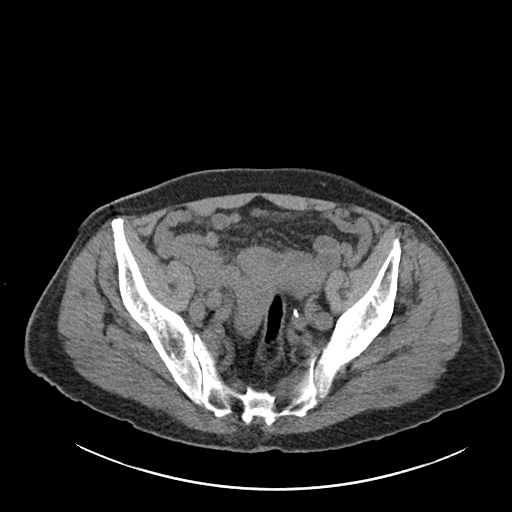
[im 36/86  soft-tissue]
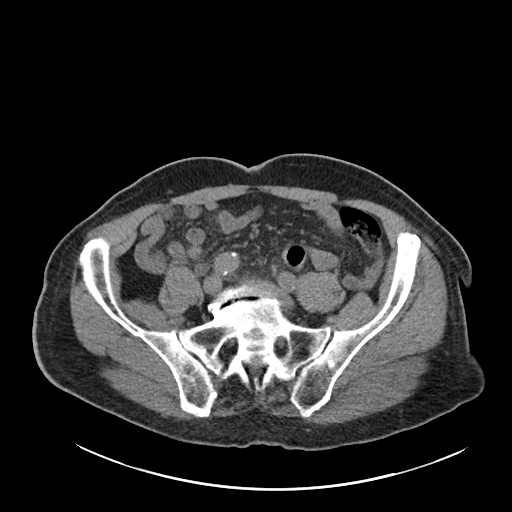
[im 43/86  soft-tissue]
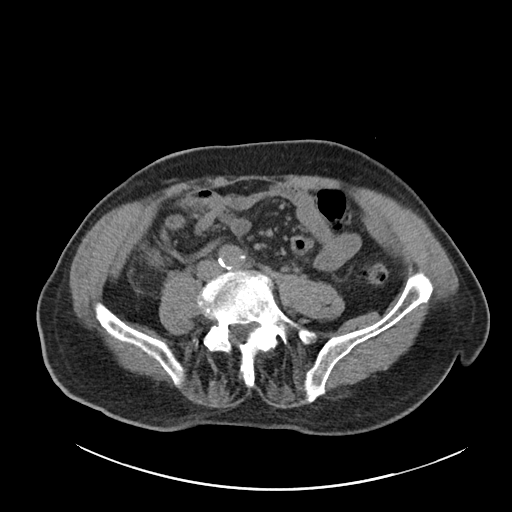
[im 50/86  soft-tissue]
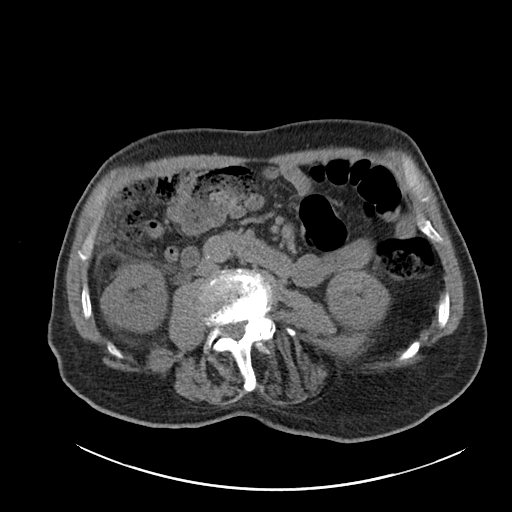
[im 57/86  soft-tissue]
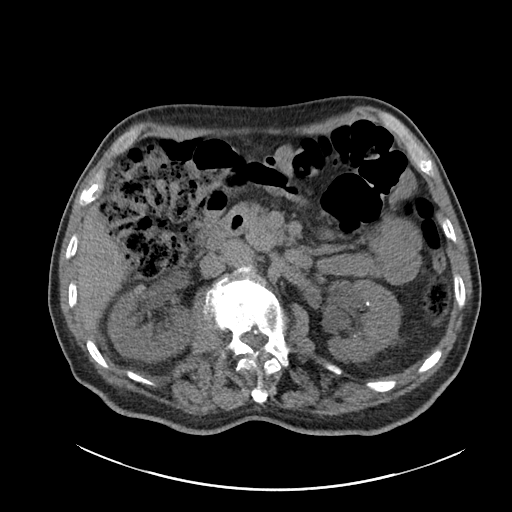
[im 57/86  bone]
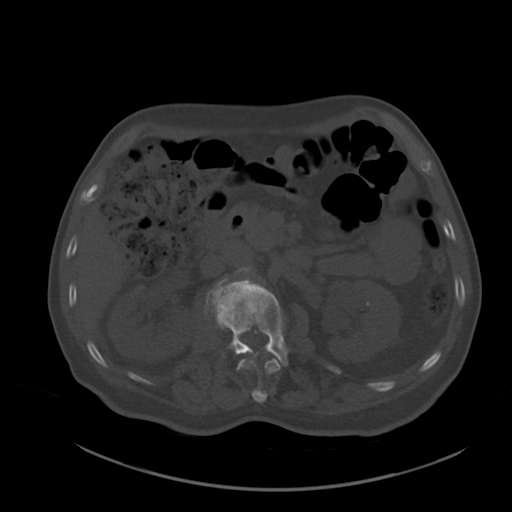
[im 61/86  soft-tissue]
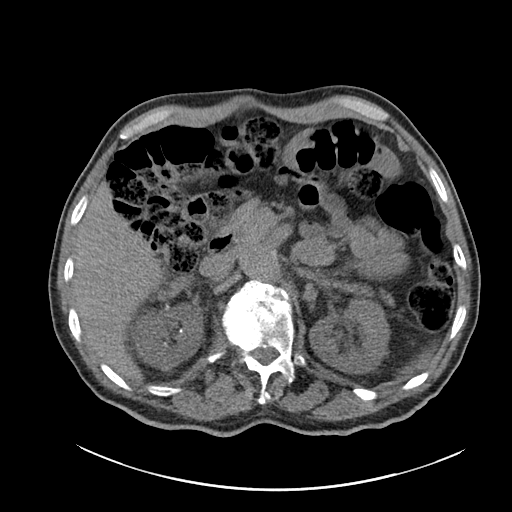
[im 68/86  soft-tissue]
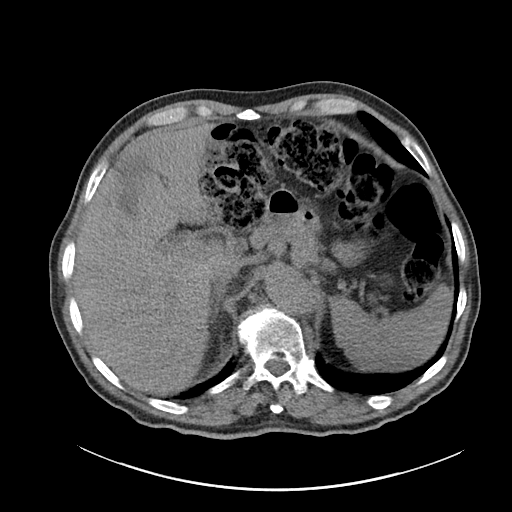
[im 75/86  soft-tissue]
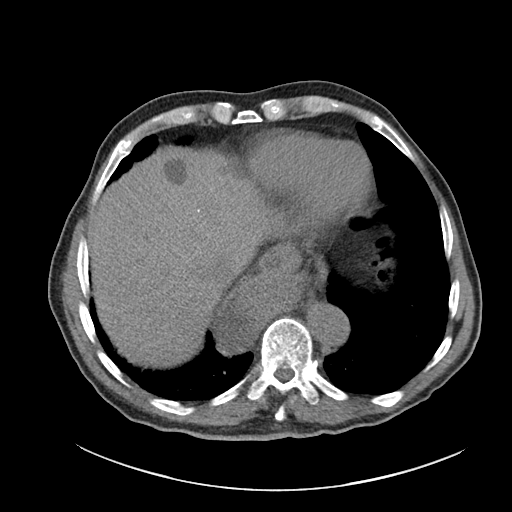
[im 82/86  soft-tissue]
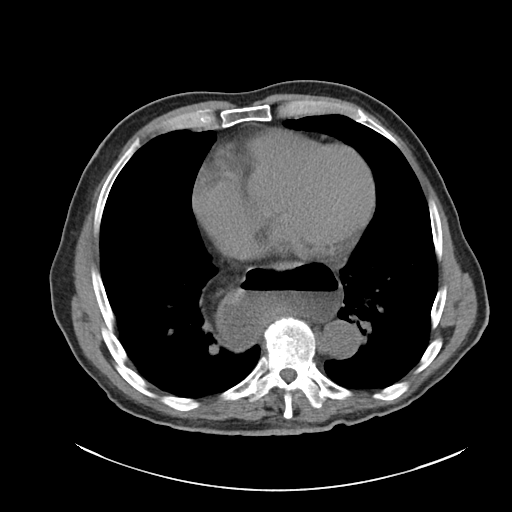

[Series 5: renal stone 3.0 cor · coronal · 0.76mm/px · 3 of 101 slices shown]
[im 34/101  soft-tissue]
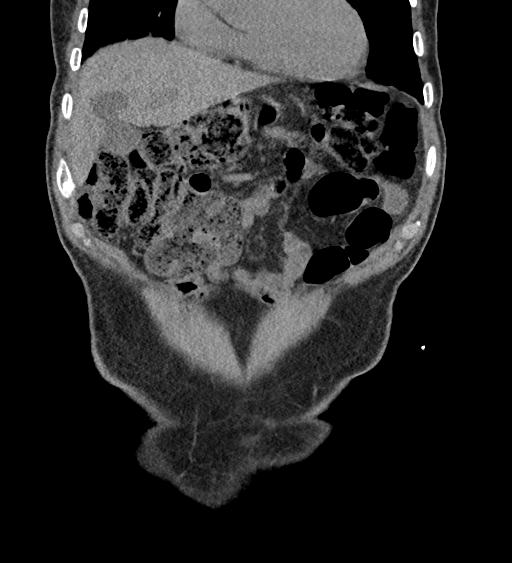
[im 45/101  soft-tissue]
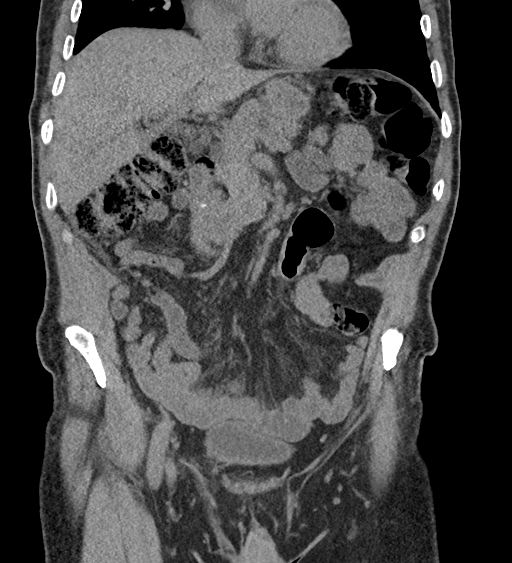
[im 56/101  soft-tissue]
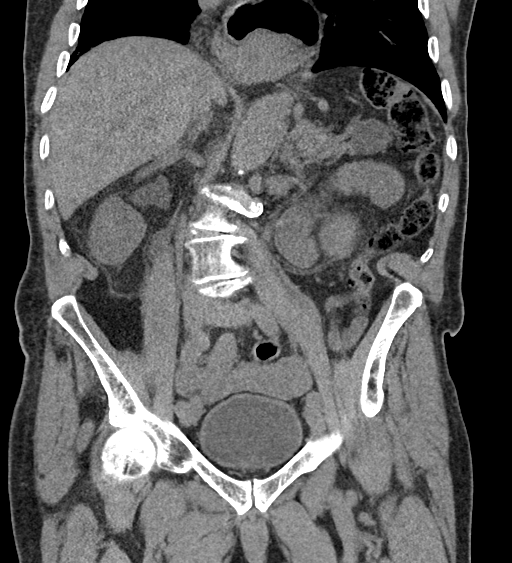

[16 of 46 positions shown; findings below may reference images not displayed]

FINDINGS: Lower chest: Large hiatal hernia is noted. Visualized lung bases are
unremarkable.

Hepatobiliary: No gallstones or biliary dilatation is noted. Hepatic
cysts are noted.

Pancreas: 2.9 cm exophytic cyst is seen arising from tail of
pancreas. No ductal dilatation or inflammation is noted.

Spleen: Normal in size without focal abnormality.

Adrenals/Urinary Tract: Adrenal glands appear normal. Bilateral
nephrolithiasis is noted. Mild right hydroureteronephrosis is noted
secondary to 7 mm calculus at right ureterovesical junction.

Stomach/Bowel: There is no evidence of bowel obstruction or
inflammation. The appendix is not clearly visualized.

Vascular/Lymphatic: Aortic atherosclerosis. No enlarged abdominal or
pelvic lymph nodes.

Reproductive: Mild prostatic enlargement is noted.

Other: No abdominal wall hernia or abnormality. No abdominopelvic
ascites.

Musculoskeletal: Severe multilevel degenerative disc disease is
noted in the lumbar spine. No acute osseous abnormality is noted.
IMPRESSION: Mild right hydroureteronephrosis is noted secondary to 7 mm calculus
at right ureterovesical junction. Bilateral nephrolithiasis is
noted.

Large sliding-type hiatal hernia is noted.

2.9 cm exophytic cyst is seen arising from pancreatic tail.
Follow-up CT scan in 6 months is recommended to ensure stability.

Mild prostatic enlargement.

Severe multilevel degenerative disc disease seen in lumbar spine.

Aortic Atherosclerosis ([id number]).

## 2020-12-31 DIAGNOSIS — Z85828 Personal history of other malignant neoplasm of skin: Secondary | ICD-10-CM | POA: Diagnosis not present

## 2020-12-31 DIAGNOSIS — L570 Actinic keratosis: Secondary | ICD-10-CM | POA: Diagnosis not present

## 2021-03-09 DIAGNOSIS — Z961 Presence of intraocular lens: Secondary | ICD-10-CM | POA: Diagnosis not present

## 2021-03-09 DIAGNOSIS — H524 Presbyopia: Secondary | ICD-10-CM | POA: Diagnosis not present

## 2021-03-09 DIAGNOSIS — H353131 Nonexudative age-related macular degeneration, bilateral, early dry stage: Secondary | ICD-10-CM | POA: Diagnosis not present

## 2021-03-09 DIAGNOSIS — L570 Actinic keratosis: Secondary | ICD-10-CM | POA: Diagnosis not present

## 2021-03-09 DIAGNOSIS — H1789 Other corneal scars and opacities: Secondary | ICD-10-CM | POA: Diagnosis not present

## 2021-03-09 DIAGNOSIS — H26493 Other secondary cataract, bilateral: Secondary | ICD-10-CM | POA: Diagnosis not present

## 2021-04-13 DIAGNOSIS — D485 Neoplasm of uncertain behavior of skin: Secondary | ICD-10-CM | POA: Diagnosis not present

## 2021-04-13 DIAGNOSIS — L570 Actinic keratosis: Secondary | ICD-10-CM | POA: Diagnosis not present

## 2021-04-27 DIAGNOSIS — L570 Actinic keratosis: Secondary | ICD-10-CM | POA: Diagnosis not present

## 2021-04-27 DIAGNOSIS — L821 Other seborrheic keratosis: Secondary | ICD-10-CM | POA: Diagnosis not present

## 2021-04-27 DIAGNOSIS — D1801 Hemangioma of skin and subcutaneous tissue: Secondary | ICD-10-CM | POA: Diagnosis not present

## 2021-06-24 DIAGNOSIS — K8020 Calculus of gallbladder without cholecystitis without obstruction: Secondary | ICD-10-CM | POA: Diagnosis not present

## 2021-06-24 DIAGNOSIS — N281 Cyst of kidney, acquired: Secondary | ICD-10-CM | POA: Diagnosis not present

## 2021-06-24 DIAGNOSIS — N200 Calculus of kidney: Secondary | ICD-10-CM | POA: Diagnosis not present

## 2021-06-24 DIAGNOSIS — K449 Diaphragmatic hernia without obstruction or gangrene: Secondary | ICD-10-CM | POA: Diagnosis not present

## 2021-08-31 DIAGNOSIS — E785 Hyperlipidemia, unspecified: Secondary | ICD-10-CM | POA: Diagnosis not present

## 2021-08-31 DIAGNOSIS — H9190 Unspecified hearing loss, unspecified ear: Secondary | ICD-10-CM | POA: Diagnosis not present

## 2021-08-31 DIAGNOSIS — I10 Essential (primary) hypertension: Secondary | ICD-10-CM | POA: Diagnosis not present

## 2021-08-31 DIAGNOSIS — M4807 Spinal stenosis, lumbosacral region: Secondary | ICD-10-CM | POA: Diagnosis not present

## 2021-08-31 DIAGNOSIS — M25511 Pain in right shoulder: Secondary | ICD-10-CM | POA: Diagnosis not present

## 2021-08-31 DIAGNOSIS — I447 Left bundle-branch block, unspecified: Secondary | ICD-10-CM | POA: Diagnosis not present

## 2021-08-31 DIAGNOSIS — R413 Other amnesia: Secondary | ICD-10-CM | POA: Diagnosis not present

## 2021-08-31 DIAGNOSIS — Z0000 Encounter for general adult medical examination without abnormal findings: Secondary | ICD-10-CM | POA: Diagnosis not present

## 2021-08-31 DIAGNOSIS — Z87442 Personal history of urinary calculi: Secondary | ICD-10-CM | POA: Diagnosis not present

## 2021-08-31 DIAGNOSIS — R296 Repeated falls: Secondary | ICD-10-CM | POA: Diagnosis not present

## 2021-08-31 DIAGNOSIS — I700 Atherosclerosis of aorta: Secondary | ICD-10-CM | POA: Diagnosis not present

## 2021-08-31 DIAGNOSIS — Z87438 Personal history of other diseases of male genital organs: Secondary | ICD-10-CM | POA: Diagnosis not present

## 2021-08-31 DIAGNOSIS — N400 Enlarged prostate without lower urinary tract symptoms: Secondary | ICD-10-CM | POA: Diagnosis not present

## 2021-08-31 DIAGNOSIS — Z1389 Encounter for screening for other disorder: Secondary | ICD-10-CM | POA: Diagnosis not present

## 2021-08-31 DIAGNOSIS — E559 Vitamin D deficiency, unspecified: Secondary | ICD-10-CM | POA: Diagnosis not present

## 2021-10-14 DIAGNOSIS — R413 Other amnesia: Secondary | ICD-10-CM | POA: Diagnosis not present

## 2021-10-14 DIAGNOSIS — I10 Essential (primary) hypertension: Secondary | ICD-10-CM | POA: Diagnosis not present

## 2021-10-28 DIAGNOSIS — I10 Essential (primary) hypertension: Secondary | ICD-10-CM | POA: Diagnosis not present

## 2021-10-28 DIAGNOSIS — Z23 Encounter for immunization: Secondary | ICD-10-CM | POA: Diagnosis not present

## 2021-10-28 DIAGNOSIS — R413 Other amnesia: Secondary | ICD-10-CM | POA: Diagnosis not present

## 2021-12-01 DIAGNOSIS — R413 Other amnesia: Secondary | ICD-10-CM | POA: Diagnosis not present

## 2021-12-01 DIAGNOSIS — L570 Actinic keratosis: Secondary | ICD-10-CM | POA: Diagnosis not present

## 2021-12-01 DIAGNOSIS — M549 Dorsalgia, unspecified: Secondary | ICD-10-CM | POA: Diagnosis not present

## 2021-12-01 DIAGNOSIS — I10 Essential (primary) hypertension: Secondary | ICD-10-CM | POA: Diagnosis not present

## 2022-01-07 DIAGNOSIS — L570 Actinic keratosis: Secondary | ICD-10-CM | POA: Diagnosis not present

## 2022-01-07 DIAGNOSIS — D0462 Carcinoma in situ of skin of left upper limb, including shoulder: Secondary | ICD-10-CM | POA: Diagnosis not present

## 2022-01-07 DIAGNOSIS — D485 Neoplasm of uncertain behavior of skin: Secondary | ICD-10-CM | POA: Diagnosis not present

## 2022-01-07 DIAGNOSIS — Z85828 Personal history of other malignant neoplasm of skin: Secondary | ICD-10-CM | POA: Diagnosis not present

## 2022-03-05 DIAGNOSIS — J309 Allergic rhinitis, unspecified: Secondary | ICD-10-CM | POA: Diagnosis not present

## 2022-04-15 DIAGNOSIS — L600 Ingrowing nail: Secondary | ICD-10-CM | POA: Diagnosis not present

## 2022-04-27 DIAGNOSIS — M12811 Other specific arthropathies, not elsewhere classified, right shoulder: Secondary | ICD-10-CM | POA: Diagnosis not present

## 2022-04-27 DIAGNOSIS — M549 Dorsalgia, unspecified: Secondary | ICD-10-CM | POA: Diagnosis not present

## 2022-04-27 DIAGNOSIS — R413 Other amnesia: Secondary | ICD-10-CM | POA: Diagnosis not present

## 2022-04-27 DIAGNOSIS — I10 Essential (primary) hypertension: Secondary | ICD-10-CM | POA: Diagnosis not present

## 2022-04-29 DIAGNOSIS — L600 Ingrowing nail: Secondary | ICD-10-CM | POA: Diagnosis not present

## 2022-06-28 DIAGNOSIS — R413 Other amnesia: Secondary | ICD-10-CM | POA: Diagnosis not present

## 2022-06-28 DIAGNOSIS — R451 Restlessness and agitation: Secondary | ICD-10-CM | POA: Diagnosis not present

## 2022-06-29 DIAGNOSIS — L570 Actinic keratosis: Secondary | ICD-10-CM | POA: Diagnosis not present

## 2022-06-29 DIAGNOSIS — Z961 Presence of intraocular lens: Secondary | ICD-10-CM | POA: Diagnosis not present

## 2022-06-29 DIAGNOSIS — H524 Presbyopia: Secondary | ICD-10-CM | POA: Diagnosis not present

## 2022-06-29 DIAGNOSIS — H353131 Nonexudative age-related macular degeneration, bilateral, early dry stage: Secondary | ICD-10-CM | POA: Diagnosis not present

## 2022-06-29 DIAGNOSIS — H26493 Other secondary cataract, bilateral: Secondary | ICD-10-CM | POA: Diagnosis not present

## 2022-06-29 DIAGNOSIS — H1789 Other corneal scars and opacities: Secondary | ICD-10-CM | POA: Diagnosis not present

## 2022-07-05 DIAGNOSIS — R413 Other amnesia: Secondary | ICD-10-CM | POA: Diagnosis not present

## 2022-09-01 DIAGNOSIS — N401 Enlarged prostate with lower urinary tract symptoms: Secondary | ICD-10-CM | POA: Diagnosis not present

## 2022-09-08 DIAGNOSIS — N400 Enlarged prostate without lower urinary tract symptoms: Secondary | ICD-10-CM | POA: Diagnosis not present

## 2022-09-08 DIAGNOSIS — N200 Calculus of kidney: Secondary | ICD-10-CM | POA: Diagnosis not present

## 2022-09-14 DIAGNOSIS — R4181 Age-related cognitive decline: Secondary | ICD-10-CM | POA: Diagnosis not present

## 2022-09-14 DIAGNOSIS — R413 Other amnesia: Secondary | ICD-10-CM | POA: Diagnosis not present

## 2022-09-14 DIAGNOSIS — Z0000 Encounter for general adult medical examination without abnormal findings: Secondary | ICD-10-CM | POA: Diagnosis not present

## 2022-09-14 DIAGNOSIS — R296 Repeated falls: Secondary | ICD-10-CM | POA: Diagnosis not present

## 2022-09-14 DIAGNOSIS — I10 Essential (primary) hypertension: Secondary | ICD-10-CM | POA: Diagnosis not present

## 2022-09-14 DIAGNOSIS — M4807 Spinal stenosis, lumbosacral region: Secondary | ICD-10-CM | POA: Diagnosis not present

## 2022-09-14 DIAGNOSIS — E7800 Pure hypercholesterolemia, unspecified: Secondary | ICD-10-CM | POA: Diagnosis not present

## 2022-09-14 DIAGNOSIS — Z1331 Encounter for screening for depression: Secondary | ICD-10-CM | POA: Diagnosis not present

## 2022-09-14 DIAGNOSIS — Z23 Encounter for immunization: Secondary | ICD-10-CM | POA: Diagnosis not present

## 2022-10-18 DIAGNOSIS — F0280 Dementia in other diseases classified elsewhere without behavioral disturbance: Secondary | ICD-10-CM | POA: Diagnosis not present

## 2022-10-18 DIAGNOSIS — Z01810 Encounter for preprocedural cardiovascular examination: Secondary | ICD-10-CM | POA: Diagnosis not present

## 2022-10-18 DIAGNOSIS — I4891 Unspecified atrial fibrillation: Secondary | ICD-10-CM | POA: Diagnosis not present

## 2022-10-18 DIAGNOSIS — G309 Alzheimer's disease, unspecified: Secondary | ICD-10-CM | POA: Diagnosis not present

## 2022-10-18 DIAGNOSIS — I447 Left bundle-branch block, unspecified: Secondary | ICD-10-CM | POA: Diagnosis not present

## 2022-11-05 DIAGNOSIS — R413 Other amnesia: Secondary | ICD-10-CM | POA: Diagnosis not present

## 2022-11-05 DIAGNOSIS — F03B11 Unspecified dementia, moderate, with agitation: Secondary | ICD-10-CM | POA: Diagnosis not present

## 2022-11-05 DIAGNOSIS — I10 Essential (primary) hypertension: Secondary | ICD-10-CM | POA: Diagnosis not present

## 2022-11-05 DIAGNOSIS — R451 Restlessness and agitation: Secondary | ICD-10-CM | POA: Diagnosis not present

## 2022-11-08 ENCOUNTER — Other Ambulatory Visit: Payer: Self-pay | Admitting: Internal Medicine

## 2022-11-08 DIAGNOSIS — R413 Other amnesia: Secondary | ICD-10-CM

## 2022-11-10 DIAGNOSIS — I447 Left bundle-branch block, unspecified: Secondary | ICD-10-CM | POA: Diagnosis not present

## 2022-11-10 DIAGNOSIS — Z01810 Encounter for preprocedural cardiovascular examination: Secondary | ICD-10-CM | POA: Diagnosis not present

## 2022-11-15 DIAGNOSIS — Z01810 Encounter for preprocedural cardiovascular examination: Secondary | ICD-10-CM | POA: Diagnosis not present

## 2022-11-15 DIAGNOSIS — I447 Left bundle-branch block, unspecified: Secondary | ICD-10-CM | POA: Diagnosis not present

## 2022-11-16 DIAGNOSIS — I440 Atrioventricular block, first degree: Secondary | ICD-10-CM | POA: Diagnosis not present

## 2022-11-16 DIAGNOSIS — I441 Atrioventricular block, second degree: Secondary | ICD-10-CM | POA: Diagnosis not present

## 2022-12-05 ENCOUNTER — Ambulatory Visit
Admission: RE | Admit: 2022-12-05 | Discharge: 2022-12-05 | Disposition: A | Discharge status: Home / Self Care | Payer: Medicare Other | Source: Ambulatory Visit | Attending: Internal Medicine | Admitting: Internal Medicine

## 2022-12-05 DIAGNOSIS — R413 Other amnesia: Secondary | ICD-10-CM | POA: Diagnosis not present

## 2022-12-05 DIAGNOSIS — G319 Degenerative disease of nervous system, unspecified: Secondary | ICD-10-CM | POA: Diagnosis not present

## 2022-12-05 DIAGNOSIS — I6782 Cerebral ischemia: Secondary | ICD-10-CM | POA: Diagnosis not present

## 2022-12-16 DIAGNOSIS — Z01810 Encounter for preprocedural cardiovascular examination: Secondary | ICD-10-CM | POA: Diagnosis not present

## 2022-12-16 DIAGNOSIS — I082 Rheumatic disorders of both aortic and tricuspid valves: Secondary | ICD-10-CM | POA: Diagnosis not present

## 2022-12-16 DIAGNOSIS — I447 Left bundle-branch block, unspecified: Secondary | ICD-10-CM | POA: Diagnosis not present

## 2022-12-16 DIAGNOSIS — I272 Pulmonary hypertension, unspecified: Secondary | ICD-10-CM | POA: Diagnosis not present

## 2022-12-17 DIAGNOSIS — I10 Essential (primary) hypertension: Secondary | ICD-10-CM | POA: Diagnosis not present

## 2022-12-17 DIAGNOSIS — M79671 Pain in right foot: Secondary | ICD-10-CM | POA: Diagnosis not present

## 2022-12-17 DIAGNOSIS — Z87442 Personal history of urinary calculi: Secondary | ICD-10-CM | POA: Diagnosis not present

## 2022-12-17 DIAGNOSIS — R296 Repeated falls: Secondary | ICD-10-CM | POA: Diagnosis not present

## 2022-12-17 DIAGNOSIS — F03B0 Unspecified dementia, moderate, without behavioral disturbance, psychotic disturbance, mood disturbance, and anxiety: Secondary | ICD-10-CM | POA: Diagnosis not present

## 2022-12-21 DIAGNOSIS — I499 Cardiac arrhythmia, unspecified: Secondary | ICD-10-CM | POA: Diagnosis not present

## 2022-12-21 DIAGNOSIS — I351 Nonrheumatic aortic (valve) insufficiency: Secondary | ICD-10-CM | POA: Diagnosis not present

## 2022-12-21 DIAGNOSIS — I447 Left bundle-branch block, unspecified: Secondary | ICD-10-CM | POA: Diagnosis not present

## 2022-12-21 DIAGNOSIS — I10 Essential (primary) hypertension: Secondary | ICD-10-CM | POA: Diagnosis not present

## 2022-12-24 DIAGNOSIS — N401 Enlarged prostate with lower urinary tract symptoms: Secondary | ICD-10-CM | POA: Diagnosis not present

## 2022-12-24 DIAGNOSIS — N200 Calculus of kidney: Secondary | ICD-10-CM | POA: Diagnosis not present

## 2023-01-12 DIAGNOSIS — I440 Atrioventricular block, first degree: Secondary | ICD-10-CM | POA: Diagnosis not present

## 2023-01-12 DIAGNOSIS — F0390 Unspecified dementia without behavioral disturbance: Secondary | ICD-10-CM | POA: Diagnosis not present

## 2023-01-12 DIAGNOSIS — N200 Calculus of kidney: Secondary | ICD-10-CM | POA: Diagnosis not present

## 2023-01-12 DIAGNOSIS — I441 Atrioventricular block, second degree: Secondary | ICD-10-CM | POA: Diagnosis not present

## 2023-01-12 DIAGNOSIS — I272 Pulmonary hypertension, unspecified: Secondary | ICD-10-CM | POA: Diagnosis not present

## 2023-01-12 DIAGNOSIS — I447 Left bundle-branch block, unspecified: Secondary | ICD-10-CM | POA: Diagnosis not present

## 2023-01-13 DIAGNOSIS — I4891 Unspecified atrial fibrillation: Secondary | ICD-10-CM | POA: Diagnosis not present

## 2023-01-13 DIAGNOSIS — I447 Left bundle-branch block, unspecified: Secondary | ICD-10-CM | POA: Diagnosis not present

## 2023-01-13 DIAGNOSIS — I498 Other specified cardiac arrhythmias: Secondary | ICD-10-CM | POA: Diagnosis not present

## 2023-01-13 DIAGNOSIS — I440 Atrioventricular block, first degree: Secondary | ICD-10-CM | POA: Diagnosis not present

## 2023-02-11 DIAGNOSIS — I495 Sick sinus syndrome: Secondary | ICD-10-CM | POA: Diagnosis not present

## 2023-02-11 DIAGNOSIS — I700 Atherosclerosis of aorta: Secondary | ICD-10-CM | POA: Diagnosis not present

## 2023-02-11 DIAGNOSIS — I4430 Unspecified atrioventricular block: Secondary | ICD-10-CM | POA: Diagnosis not present

## 2023-02-11 DIAGNOSIS — Z452 Encounter for adjustment and management of vascular access device: Secondary | ICD-10-CM | POA: Diagnosis not present

## 2023-02-11 DIAGNOSIS — I4891 Unspecified atrial fibrillation: Secondary | ICD-10-CM | POA: Diagnosis not present

## 2023-02-11 DIAGNOSIS — I441 Atrioventricular block, second degree: Secondary | ICD-10-CM | POA: Diagnosis not present

## 2023-02-11 DIAGNOSIS — I10 Essential (primary) hypertension: Secondary | ICD-10-CM | POA: Diagnosis not present

## 2023-02-11 DIAGNOSIS — I447 Left bundle-branch block, unspecified: Secondary | ICD-10-CM | POA: Diagnosis not present

## 2023-02-11 DIAGNOSIS — I272 Pulmonary hypertension, unspecified: Secondary | ICD-10-CM | POA: Diagnosis not present

## 2023-02-11 DIAGNOSIS — F03A0 Unspecified dementia, mild, without behavioral disturbance, psychotic disturbance, mood disturbance, and anxiety: Secondary | ICD-10-CM | POA: Diagnosis not present

## 2023-02-12 DIAGNOSIS — I447 Left bundle-branch block, unspecified: Secondary | ICD-10-CM | POA: Diagnosis not present

## 2023-02-12 DIAGNOSIS — I272 Pulmonary hypertension, unspecified: Secondary | ICD-10-CM | POA: Diagnosis not present

## 2023-02-12 DIAGNOSIS — I495 Sick sinus syndrome: Secondary | ICD-10-CM | POA: Diagnosis not present

## 2023-02-12 DIAGNOSIS — Z950 Presence of cardiac pacemaker: Secondary | ICD-10-CM | POA: Diagnosis not present

## 2023-02-12 DIAGNOSIS — J9811 Atelectasis: Secondary | ICD-10-CM | POA: Diagnosis not present

## 2023-02-12 DIAGNOSIS — I441 Atrioventricular block, second degree: Secondary | ICD-10-CM | POA: Diagnosis not present

## 2023-02-12 DIAGNOSIS — I700 Atherosclerosis of aorta: Secondary | ICD-10-CM | POA: Diagnosis not present

## 2023-02-12 DIAGNOSIS — I499 Cardiac arrhythmia, unspecified: Secondary | ICD-10-CM | POA: Diagnosis not present

## 2023-02-12 DIAGNOSIS — I10 Essential (primary) hypertension: Secondary | ICD-10-CM | POA: Diagnosis not present

## 2023-02-23 DIAGNOSIS — Z45018 Encounter for adjustment and management of other part of cardiac pacemaker: Secondary | ICD-10-CM | POA: Diagnosis not present

## 2023-02-23 DIAGNOSIS — R001 Bradycardia, unspecified: Secondary | ICD-10-CM | POA: Diagnosis not present

## 2023-04-19 DIAGNOSIS — I10 Essential (primary) hypertension: Secondary | ICD-10-CM | POA: Diagnosis not present

## 2023-04-19 DIAGNOSIS — F03B0 Unspecified dementia, moderate, without behavioral disturbance, psychotic disturbance, mood disturbance, and anxiety: Secondary | ICD-10-CM | POA: Diagnosis not present

## 2023-05-13 DIAGNOSIS — Z45018 Encounter for adjustment and management of other part of cardiac pacemaker: Secondary | ICD-10-CM | POA: Diagnosis not present

## 2023-05-23 DIAGNOSIS — I447 Left bundle-branch block, unspecified: Secondary | ICD-10-CM | POA: Diagnosis not present

## 2023-05-23 DIAGNOSIS — Z45018 Encounter for adjustment and management of other part of cardiac pacemaker: Secondary | ICD-10-CM | POA: Diagnosis not present

## 2023-05-23 DIAGNOSIS — R001 Bradycardia, unspecified: Secondary | ICD-10-CM | POA: Diagnosis not present

## 2023-05-24 DIAGNOSIS — N200 Calculus of kidney: Secondary | ICD-10-CM | POA: Diagnosis not present

## 2023-08-22 DIAGNOSIS — Z45018 Encounter for adjustment and management of other part of cardiac pacemaker: Secondary | ICD-10-CM | POA: Diagnosis not present

## 2023-09-29 DIAGNOSIS — Z1331 Encounter for screening for depression: Secondary | ICD-10-CM | POA: Diagnosis not present

## 2023-09-29 DIAGNOSIS — Z23 Encounter for immunization: Secondary | ICD-10-CM | POA: Diagnosis not present

## 2023-09-29 DIAGNOSIS — R451 Restlessness and agitation: Secondary | ICD-10-CM | POA: Diagnosis not present

## 2023-09-29 DIAGNOSIS — R4181 Age-related cognitive decline: Secondary | ICD-10-CM | POA: Diagnosis not present

## 2023-09-29 DIAGNOSIS — Z79899 Other long term (current) drug therapy: Secondary | ICD-10-CM | POA: Diagnosis not present

## 2023-09-29 DIAGNOSIS — M4807 Spinal stenosis, lumbosacral region: Secondary | ICD-10-CM | POA: Diagnosis not present

## 2023-09-29 DIAGNOSIS — I10 Essential (primary) hypertension: Secondary | ICD-10-CM | POA: Diagnosis not present

## 2023-09-29 DIAGNOSIS — F03B2 Unspecified dementia, moderate, with psychotic disturbance: Secondary | ICD-10-CM | POA: Diagnosis not present

## 2023-09-29 DIAGNOSIS — R350 Frequency of micturition: Secondary | ICD-10-CM | POA: Diagnosis not present

## 2023-09-29 DIAGNOSIS — K5900 Constipation, unspecified: Secondary | ICD-10-CM | POA: Diagnosis not present

## 2023-09-29 DIAGNOSIS — E538 Deficiency of other specified B group vitamins: Secondary | ICD-10-CM | POA: Diagnosis not present

## 2023-09-29 DIAGNOSIS — R296 Repeated falls: Secondary | ICD-10-CM | POA: Diagnosis not present

## 2023-09-29 DIAGNOSIS — Z0000 Encounter for general adult medical examination without abnormal findings: Secondary | ICD-10-CM | POA: Diagnosis not present

## 2023-09-29 DIAGNOSIS — E7800 Pure hypercholesterolemia, unspecified: Secondary | ICD-10-CM | POA: Diagnosis not present

## 2023-10-13 DIAGNOSIS — R0689 Other abnormalities of breathing: Secondary | ICD-10-CM | POA: Diagnosis not present

## 2023-10-13 DIAGNOSIS — G9341 Metabolic encephalopathy: Secondary | ICD-10-CM | POA: Diagnosis present

## 2023-10-13 DIAGNOSIS — I82611 Acute embolism and thrombosis of superficial veins of right upper extremity: Secondary | ICD-10-CM | POA: Diagnosis not present

## 2023-10-13 DIAGNOSIS — R4182 Altered mental status, unspecified: Secondary | ICD-10-CM | POA: Diagnosis not present

## 2023-10-13 DIAGNOSIS — Z1152 Encounter for screening for COVID-19: Secondary | ICD-10-CM | POA: Diagnosis not present

## 2023-10-13 DIAGNOSIS — M1389 Other specified arthritis, multiple sites: Secondary | ICD-10-CM | POA: Diagnosis present

## 2023-10-13 DIAGNOSIS — F0390 Unspecified dementia without behavioral disturbance: Secondary | ICD-10-CM | POA: Diagnosis not present

## 2023-10-13 DIAGNOSIS — Z79899 Other long term (current) drug therapy: Secondary | ICD-10-CM | POA: Diagnosis not present

## 2023-10-13 DIAGNOSIS — I444 Left anterior fascicular block: Secondary | ICD-10-CM | POA: Diagnosis not present

## 2023-10-13 DIAGNOSIS — R404 Transient alteration of awareness: Secondary | ICD-10-CM | POA: Diagnosis not present

## 2023-10-13 DIAGNOSIS — F03C11 Unspecified dementia, severe, with agitation: Secondary | ICD-10-CM | POA: Diagnosis not present

## 2023-10-13 DIAGNOSIS — R2689 Other abnormalities of gait and mobility: Secondary | ICD-10-CM | POA: Diagnosis present

## 2023-10-13 DIAGNOSIS — A419 Sepsis, unspecified organism: Secondary | ICD-10-CM | POA: Diagnosis not present

## 2023-10-13 DIAGNOSIS — M1990 Unspecified osteoarthritis, unspecified site: Secondary | ICD-10-CM | POA: Diagnosis present

## 2023-10-13 DIAGNOSIS — R41841 Cognitive communication deficit: Secondary | ICD-10-CM | POA: Diagnosis present

## 2023-10-13 DIAGNOSIS — F02A11 Dementia in other diseases classified elsewhere, mild, with agitation: Secondary | ICD-10-CM | POA: Diagnosis present

## 2023-10-13 DIAGNOSIS — N390 Urinary tract infection, site not specified: Secondary | ICD-10-CM | POA: Diagnosis not present

## 2023-10-13 DIAGNOSIS — E441 Mild protein-calorie malnutrition: Secondary | ICD-10-CM | POA: Diagnosis present

## 2023-10-13 DIAGNOSIS — G301 Alzheimer's disease with late onset: Secondary | ICD-10-CM | POA: Diagnosis present

## 2023-10-13 DIAGNOSIS — R9431 Abnormal electrocardiogram [ECG] [EKG]: Secondary | ICD-10-CM | POA: Diagnosis not present

## 2023-10-13 DIAGNOSIS — Z950 Presence of cardiac pacemaker: Secondary | ICD-10-CM | POA: Diagnosis not present

## 2023-10-13 DIAGNOSIS — R456 Violent behavior: Secondary | ICD-10-CM | POA: Diagnosis not present

## 2023-10-13 DIAGNOSIS — N1330 Unspecified hydronephrosis: Secondary | ICD-10-CM | POA: Diagnosis not present

## 2023-10-13 DIAGNOSIS — R410 Disorientation, unspecified: Secondary | ICD-10-CM | POA: Diagnosis not present

## 2023-10-13 DIAGNOSIS — G9340 Encephalopathy, unspecified: Secondary | ICD-10-CM | POA: Diagnosis not present

## 2023-10-13 DIAGNOSIS — R2681 Unsteadiness on feet: Secondary | ICD-10-CM | POA: Diagnosis present

## 2023-10-13 DIAGNOSIS — N178 Other acute kidney failure: Secondary | ICD-10-CM | POA: Diagnosis present

## 2023-10-13 DIAGNOSIS — N179 Acute kidney failure, unspecified: Secondary | ICD-10-CM | POA: Diagnosis present

## 2023-10-13 DIAGNOSIS — Z66 Do not resuscitate: Secondary | ICD-10-CM | POA: Diagnosis present

## 2023-10-13 DIAGNOSIS — Z87440 Personal history of urinary (tract) infections: Secondary | ICD-10-CM | POA: Diagnosis not present

## 2023-10-13 DIAGNOSIS — Z87442 Personal history of urinary calculi: Secondary | ICD-10-CM | POA: Diagnosis not present

## 2023-10-13 DIAGNOSIS — Z880 Allergy status to penicillin: Secondary | ICD-10-CM | POA: Diagnosis not present

## 2023-10-13 DIAGNOSIS — Z8711 Personal history of peptic ulcer disease: Secondary | ICD-10-CM | POA: Diagnosis not present

## 2023-10-13 DIAGNOSIS — R319 Hematuria, unspecified: Secondary | ICD-10-CM | POA: Diagnosis not present

## 2023-10-13 DIAGNOSIS — N400 Enlarged prostate without lower urinary tract symptoms: Secondary | ICD-10-CM | POA: Diagnosis present

## 2023-10-13 DIAGNOSIS — R1319 Other dysphagia: Secondary | ICD-10-CM | POA: Diagnosis present

## 2023-10-13 DIAGNOSIS — I10 Essential (primary) hypertension: Secondary | ICD-10-CM | POA: Diagnosis present

## 2023-10-13 DIAGNOSIS — E875 Hyperkalemia: Secondary | ICD-10-CM | POA: Diagnosis present

## 2023-10-13 DIAGNOSIS — R310 Gross hematuria: Secondary | ICD-10-CM | POA: Diagnosis present

## 2023-10-13 DIAGNOSIS — M6281 Muscle weakness (generalized): Secondary | ICD-10-CM | POA: Diagnosis present

## 2023-10-13 DIAGNOSIS — J189 Pneumonia, unspecified organism: Secondary | ICD-10-CM | POA: Diagnosis present

## 2023-10-13 DIAGNOSIS — Z7401 Bed confinement status: Secondary | ICD-10-CM | POA: Diagnosis not present

## 2023-10-13 DIAGNOSIS — I252 Old myocardial infarction: Secondary | ICD-10-CM | POA: Diagnosis not present

## 2023-10-13 DIAGNOSIS — N401 Enlarged prostate with lower urinary tract symptoms: Secondary | ICD-10-CM | POA: Diagnosis present

## 2023-10-21 DIAGNOSIS — R410 Disorientation, unspecified: Secondary | ICD-10-CM | POA: Diagnosis not present

## 2023-10-21 DIAGNOSIS — F02B18 Dementia in other diseases classified elsewhere, moderate, with other behavioral disturbance: Secondary | ICD-10-CM | POA: Diagnosis not present

## 2023-10-21 DIAGNOSIS — M1389 Other specified arthritis, multiple sites: Secondary | ICD-10-CM | POA: Diagnosis not present

## 2023-10-21 DIAGNOSIS — I10 Essential (primary) hypertension: Secondary | ICD-10-CM | POA: Diagnosis not present

## 2023-10-21 DIAGNOSIS — F4325 Adjustment disorder with mixed disturbance of emotions and conduct: Secondary | ICD-10-CM | POA: Diagnosis not present

## 2023-10-21 DIAGNOSIS — N179 Acute kidney failure, unspecified: Secondary | ICD-10-CM | POA: Diagnosis not present

## 2023-10-21 DIAGNOSIS — Z7189 Other specified counseling: Secondary | ICD-10-CM | POA: Diagnosis not present

## 2023-10-21 DIAGNOSIS — Z7401 Bed confinement status: Secondary | ICD-10-CM | POA: Diagnosis not present

## 2023-10-21 DIAGNOSIS — F02A11 Dementia in other diseases classified elsewhere, mild, with agitation: Secondary | ICD-10-CM | POA: Diagnosis not present

## 2023-10-21 DIAGNOSIS — G9341 Metabolic encephalopathy: Secondary | ICD-10-CM | POA: Diagnosis not present

## 2023-10-21 DIAGNOSIS — G301 Alzheimer's disease with late onset: Secondary | ICD-10-CM | POA: Diagnosis not present

## 2023-10-21 DIAGNOSIS — N401 Enlarged prostate with lower urinary tract symptoms: Secondary | ICD-10-CM | POA: Diagnosis not present

## 2023-10-21 DIAGNOSIS — F03C11 Unspecified dementia, severe, with agitation: Secondary | ICD-10-CM | POA: Diagnosis not present

## 2023-10-21 DIAGNOSIS — R1319 Other dysphagia: Secondary | ICD-10-CM | POA: Diagnosis not present

## 2023-10-21 DIAGNOSIS — Z87440 Personal history of urinary (tract) infections: Secondary | ICD-10-CM | POA: Diagnosis not present

## 2023-10-21 DIAGNOSIS — R310 Gross hematuria: Secondary | ICD-10-CM | POA: Diagnosis not present

## 2023-10-21 DIAGNOSIS — R455 Hostility: Secondary | ICD-10-CM | POA: Diagnosis not present

## 2023-10-21 DIAGNOSIS — R5381 Other malaise: Secondary | ICD-10-CM | POA: Diagnosis not present

## 2023-10-21 DIAGNOSIS — M6281 Muscle weakness (generalized): Secondary | ICD-10-CM | POA: Diagnosis not present

## 2023-10-21 DIAGNOSIS — N390 Urinary tract infection, site not specified: Secondary | ICD-10-CM | POA: Diagnosis not present

## 2023-10-21 DIAGNOSIS — R2681 Unsteadiness on feet: Secondary | ICD-10-CM | POA: Diagnosis not present

## 2023-10-21 DIAGNOSIS — R2689 Other abnormalities of gait and mobility: Secondary | ICD-10-CM | POA: Diagnosis not present

## 2023-10-21 DIAGNOSIS — J189 Pneumonia, unspecified organism: Secondary | ICD-10-CM | POA: Diagnosis not present

## 2023-10-21 DIAGNOSIS — E441 Mild protein-calorie malnutrition: Secondary | ICD-10-CM | POA: Diagnosis not present

## 2023-10-21 DIAGNOSIS — F5105 Insomnia due to other mental disorder: Secondary | ICD-10-CM | POA: Diagnosis not present

## 2023-10-21 DIAGNOSIS — R41841 Cognitive communication deficit: Secondary | ICD-10-CM | POA: Diagnosis not present

## 2023-10-21 DIAGNOSIS — N178 Other acute kidney failure: Secondary | ICD-10-CM | POA: Diagnosis not present

## 2023-10-21 DIAGNOSIS — R404 Transient alteration of awareness: Secondary | ICD-10-CM | POA: Diagnosis not present

## 2023-10-24 DIAGNOSIS — Z7189 Other specified counseling: Secondary | ICD-10-CM | POA: Diagnosis not present

## 2023-10-24 DIAGNOSIS — G9341 Metabolic encephalopathy: Secondary | ICD-10-CM | POA: Diagnosis not present

## 2023-10-24 DIAGNOSIS — R5381 Other malaise: Secondary | ICD-10-CM | POA: Diagnosis not present

## 2023-10-24 DIAGNOSIS — N390 Urinary tract infection, site not specified: Secondary | ICD-10-CM | POA: Diagnosis not present

## 2023-10-25 DIAGNOSIS — F4325 Adjustment disorder with mixed disturbance of emotions and conduct: Secondary | ICD-10-CM | POA: Diagnosis not present

## 2023-10-25 DIAGNOSIS — G9341 Metabolic encephalopathy: Secondary | ICD-10-CM | POA: Diagnosis not present

## 2023-10-25 DIAGNOSIS — R5381 Other malaise: Secondary | ICD-10-CM | POA: Diagnosis not present

## 2023-10-25 DIAGNOSIS — N390 Urinary tract infection, site not specified: Secondary | ICD-10-CM | POA: Diagnosis not present

## 2023-10-25 DIAGNOSIS — F5105 Insomnia due to other mental disorder: Secondary | ICD-10-CM | POA: Diagnosis not present

## 2023-10-25 DIAGNOSIS — F02B18 Dementia in other diseases classified elsewhere, moderate, with other behavioral disturbance: Secondary | ICD-10-CM | POA: Diagnosis not present

## 2023-10-25 DIAGNOSIS — G301 Alzheimer's disease with late onset: Secondary | ICD-10-CM | POA: Diagnosis not present

## 2023-10-31 DIAGNOSIS — Z87440 Personal history of urinary (tract) infections: Secondary | ICD-10-CM | POA: Diagnosis not present

## 2023-10-31 DIAGNOSIS — G9341 Metabolic encephalopathy: Secondary | ICD-10-CM | POA: Diagnosis not present

## 2023-10-31 DIAGNOSIS — R5381 Other malaise: Secondary | ICD-10-CM | POA: Diagnosis not present

## 2023-11-03 DIAGNOSIS — R455 Hostility: Secondary | ICD-10-CM | POA: Diagnosis not present

## 2023-11-03 DIAGNOSIS — G9341 Metabolic encephalopathy: Secondary | ICD-10-CM | POA: Diagnosis not present

## 2023-11-08 DIAGNOSIS — F02B18 Dementia in other diseases classified elsewhere, moderate, with other behavioral disturbance: Secondary | ICD-10-CM | POA: Diagnosis not present

## 2023-11-08 DIAGNOSIS — F4325 Adjustment disorder with mixed disturbance of emotions and conduct: Secondary | ICD-10-CM | POA: Diagnosis not present

## 2023-11-08 DIAGNOSIS — F5105 Insomnia due to other mental disorder: Secondary | ICD-10-CM | POA: Diagnosis not present

## 2023-11-08 DIAGNOSIS — G301 Alzheimer's disease with late onset: Secondary | ICD-10-CM | POA: Diagnosis not present

## 2023-11-18 DIAGNOSIS — R455 Hostility: Secondary | ICD-10-CM | POA: Diagnosis not present

## 2023-11-18 DIAGNOSIS — R5381 Other malaise: Secondary | ICD-10-CM | POA: Diagnosis not present

## 2023-11-18 DIAGNOSIS — G9341 Metabolic encephalopathy: Secondary | ICD-10-CM | POA: Diagnosis not present

## 2023-11-22 DIAGNOSIS — F02B18 Dementia in other diseases classified elsewhere, moderate, with other behavioral disturbance: Secondary | ICD-10-CM | POA: Diagnosis not present

## 2023-11-22 DIAGNOSIS — G301 Alzheimer's disease with late onset: Secondary | ICD-10-CM | POA: Diagnosis not present

## 2023-11-22 DIAGNOSIS — F4325 Adjustment disorder with mixed disturbance of emotions and conduct: Secondary | ICD-10-CM | POA: Diagnosis not present

## 2023-11-22 DIAGNOSIS — F5105 Insomnia due to other mental disorder: Secondary | ICD-10-CM | POA: Diagnosis not present

## 2023-12-05 DIAGNOSIS — I10 Essential (primary) hypertension: Secondary | ICD-10-CM | POA: Diagnosis not present

## 2023-12-05 DIAGNOSIS — S0101XA Laceration without foreign body of scalp, initial encounter: Secondary | ICD-10-CM | POA: Diagnosis not present

## 2023-12-05 DIAGNOSIS — Z23 Encounter for immunization: Secondary | ICD-10-CM | POA: Diagnosis not present

## 2023-12-05 DIAGNOSIS — S0003XA Contusion of scalp, initial encounter: Secondary | ICD-10-CM | POA: Diagnosis not present

## 2023-12-05 DIAGNOSIS — R609 Edema, unspecified: Secondary | ICD-10-CM | POA: Diagnosis not present

## 2023-12-05 DIAGNOSIS — R410 Disorientation, unspecified: Secondary | ICD-10-CM | POA: Diagnosis not present

## 2023-12-05 DIAGNOSIS — Z743 Need for continuous supervision: Secondary | ICD-10-CM | POA: Diagnosis not present

## 2023-12-05 DIAGNOSIS — W19XXXA Unspecified fall, initial encounter: Secondary | ICD-10-CM | POA: Diagnosis not present

## 2023-12-06 DIAGNOSIS — G301 Alzheimer's disease with late onset: Secondary | ICD-10-CM | POA: Diagnosis not present

## 2023-12-06 DIAGNOSIS — F02B18 Dementia in other diseases classified elsewhere, moderate, with other behavioral disturbance: Secondary | ICD-10-CM | POA: Diagnosis not present

## 2023-12-06 DIAGNOSIS — F5105 Insomnia due to other mental disorder: Secondary | ICD-10-CM | POA: Diagnosis not present

## 2023-12-06 DIAGNOSIS — F4325 Adjustment disorder with mixed disturbance of emotions and conduct: Secondary | ICD-10-CM | POA: Diagnosis not present

## 2023-12-08 DIAGNOSIS — W19XXXA Unspecified fall, initial encounter: Secondary | ICD-10-CM | POA: Diagnosis not present

## 2023-12-08 DIAGNOSIS — R296 Repeated falls: Secondary | ICD-10-CM | POA: Diagnosis not present

## 2023-12-08 DIAGNOSIS — R5381 Other malaise: Secondary | ICD-10-CM | POA: Diagnosis not present

## 2023-12-08 DIAGNOSIS — G9341 Metabolic encephalopathy: Secondary | ICD-10-CM | POA: Diagnosis not present

## 2023-12-08 DIAGNOSIS — W050XXA Fall from non-moving wheelchair, initial encounter: Secondary | ICD-10-CM | POA: Diagnosis not present

## 2023-12-08 DIAGNOSIS — S0101XD Laceration without foreign body of scalp, subsequent encounter: Secondary | ICD-10-CM | POA: Diagnosis not present

## 2023-12-08 DIAGNOSIS — S0181XA Laceration without foreign body of other part of head, initial encounter: Secondary | ICD-10-CM | POA: Diagnosis not present

## 2023-12-08 DIAGNOSIS — S01112A Laceration without foreign body of left eyelid and periocular area, initial encounter: Secondary | ICD-10-CM | POA: Diagnosis not present

## 2023-12-08 DIAGNOSIS — S02122A Fracture of orbital roof, left side, initial encounter for closed fracture: Secondary | ICD-10-CM | POA: Diagnosis not present

## 2023-12-08 DIAGNOSIS — S022XXA Fracture of nasal bones, initial encounter for closed fracture: Secondary | ICD-10-CM | POA: Diagnosis not present

## 2023-12-08 DIAGNOSIS — F0390 Unspecified dementia without behavioral disturbance: Secondary | ICD-10-CM | POA: Diagnosis not present

## 2023-12-08 DIAGNOSIS — I10 Essential (primary) hypertension: Secondary | ICD-10-CM | POA: Diagnosis not present

## 2023-12-08 DIAGNOSIS — R531 Weakness: Secondary | ICD-10-CM | POA: Diagnosis not present

## 2023-12-09 DIAGNOSIS — Z7401 Bed confinement status: Secondary | ICD-10-CM | POA: Diagnosis not present

## 2023-12-09 DIAGNOSIS — R531 Weakness: Secondary | ICD-10-CM | POA: Diagnosis not present

## 2023-12-12 DIAGNOSIS — F02A11 Dementia in other diseases classified elsewhere, mild, with agitation: Secondary | ICD-10-CM | POA: Diagnosis not present

## 2023-12-12 DIAGNOSIS — I10 Essential (primary) hypertension: Secondary | ICD-10-CM | POA: Diagnosis not present

## 2023-12-12 DIAGNOSIS — N390 Urinary tract infection, site not specified: Secondary | ICD-10-CM | POA: Diagnosis not present

## 2023-12-12 DIAGNOSIS — R5381 Other malaise: Secondary | ICD-10-CM | POA: Diagnosis not present

## 2023-12-12 DIAGNOSIS — R296 Repeated falls: Secondary | ICD-10-CM | POA: Diagnosis not present

## 2023-12-18 DIAGNOSIS — Z45018 Encounter for adjustment and management of other part of cardiac pacemaker: Secondary | ICD-10-CM | POA: Diagnosis not present

## 2024-01-03 DIAGNOSIS — F5105 Insomnia due to other mental disorder: Secondary | ICD-10-CM | POA: Diagnosis not present

## 2024-01-03 DIAGNOSIS — F4325 Adjustment disorder with mixed disturbance of emotions and conduct: Secondary | ICD-10-CM | POA: Diagnosis not present

## 2024-01-03 DIAGNOSIS — G301 Alzheimer's disease with late onset: Secondary | ICD-10-CM | POA: Diagnosis not present

## 2024-01-03 DIAGNOSIS — M19011 Primary osteoarthritis, right shoulder: Secondary | ICD-10-CM | POA: Diagnosis not present

## 2024-01-03 DIAGNOSIS — F02B18 Dementia in other diseases classified elsewhere, moderate, with other behavioral disturbance: Secondary | ICD-10-CM | POA: Diagnosis not present

## 2024-01-09 DIAGNOSIS — M25511 Pain in right shoulder: Secondary | ICD-10-CM | POA: Diagnosis not present

## 2024-01-09 DIAGNOSIS — M19011 Primary osteoarthritis, right shoulder: Secondary | ICD-10-CM | POA: Diagnosis not present

## 2024-01-09 DIAGNOSIS — I10 Essential (primary) hypertension: Secondary | ICD-10-CM | POA: Diagnosis not present

## 2024-01-09 DIAGNOSIS — R5381 Other malaise: Secondary | ICD-10-CM | POA: Diagnosis not present

## 2024-01-09 DIAGNOSIS — F02A11 Dementia in other diseases classified elsewhere, mild, with agitation: Secondary | ICD-10-CM | POA: Diagnosis not present

## 2024-01-16 DIAGNOSIS — S72041A Displaced fracture of base of neck of right femur, initial encounter for closed fracture: Secondary | ICD-10-CM | POA: Diagnosis not present

## 2024-01-16 DIAGNOSIS — S72001A Fracture of unspecified part of neck of right femur, initial encounter for closed fracture: Secondary | ICD-10-CM | POA: Diagnosis not present

## 2024-01-16 DIAGNOSIS — R410 Disorientation, unspecified: Secondary | ICD-10-CM | POA: Diagnosis not present

## 2024-01-16 DIAGNOSIS — Z66 Do not resuscitate: Secondary | ICD-10-CM | POA: Diagnosis present

## 2024-01-16 DIAGNOSIS — M25551 Pain in right hip: Secondary | ICD-10-CM | POA: Diagnosis not present

## 2024-01-16 DIAGNOSIS — R5381 Other malaise: Secondary | ICD-10-CM | POA: Diagnosis not present

## 2024-01-16 DIAGNOSIS — S72001S Fracture of unspecified part of neck of right femur, sequela: Secondary | ICD-10-CM | POA: Diagnosis not present

## 2024-01-16 DIAGNOSIS — G301 Alzheimer's disease with late onset: Secondary | ICD-10-CM | POA: Diagnosis not present

## 2024-01-16 DIAGNOSIS — E43 Unspecified severe protein-calorie malnutrition: Secondary | ICD-10-CM | POA: Diagnosis not present

## 2024-01-16 DIAGNOSIS — F0390 Unspecified dementia without behavioral disturbance: Secondary | ICD-10-CM | POA: Diagnosis not present

## 2024-01-16 DIAGNOSIS — M1990 Unspecified osteoarthritis, unspecified site: Secondary | ICD-10-CM | POA: Diagnosis not present

## 2024-01-16 DIAGNOSIS — Z515 Encounter for palliative care: Secondary | ICD-10-CM | POA: Diagnosis not present

## 2024-01-16 DIAGNOSIS — Z8711 Personal history of peptic ulcer disease: Secondary | ICD-10-CM | POA: Diagnosis not present

## 2024-01-16 DIAGNOSIS — S79919A Unspecified injury of unspecified hip, initial encounter: Secondary | ICD-10-CM | POA: Diagnosis not present

## 2024-01-16 DIAGNOSIS — W19XXXA Unspecified fall, initial encounter: Secondary | ICD-10-CM | POA: Diagnosis not present

## 2024-01-16 DIAGNOSIS — R296 Repeated falls: Secondary | ICD-10-CM | POA: Diagnosis present

## 2024-01-16 DIAGNOSIS — Z87442 Personal history of urinary calculi: Secondary | ICD-10-CM | POA: Diagnosis not present

## 2024-01-16 DIAGNOSIS — Z79899 Other long term (current) drug therapy: Secondary | ICD-10-CM | POA: Diagnosis not present

## 2024-01-16 DIAGNOSIS — Z6820 Body mass index (BMI) 20.0-20.9, adult: Secondary | ICD-10-CM | POA: Diagnosis not present

## 2024-01-16 DIAGNOSIS — F02A11 Dementia in other diseases classified elsewhere, mild, with agitation: Secondary | ICD-10-CM | POA: Diagnosis not present

## 2024-01-16 DIAGNOSIS — R0902 Hypoxemia: Secondary | ICD-10-CM | POA: Diagnosis not present

## 2024-01-16 DIAGNOSIS — R9431 Abnormal electrocardiogram [ECG] [EKG]: Secondary | ICD-10-CM | POA: Diagnosis not present

## 2024-01-16 DIAGNOSIS — Z880 Allergy status to penicillin: Secondary | ICD-10-CM | POA: Diagnosis not present

## 2024-01-16 DIAGNOSIS — S72051A Unspecified fracture of head of right femur, initial encounter for closed fracture: Secondary | ICD-10-CM | POA: Diagnosis not present

## 2024-01-16 DIAGNOSIS — G8911 Acute pain due to trauma: Secondary | ICD-10-CM | POA: Diagnosis not present

## 2024-01-16 DIAGNOSIS — N400 Enlarged prostate without lower urinary tract symptoms: Secondary | ICD-10-CM | POA: Diagnosis not present

## 2024-01-16 DIAGNOSIS — I10 Essential (primary) hypertension: Secondary | ICD-10-CM | POA: Diagnosis not present

## 2024-01-16 DIAGNOSIS — F03C0 Unspecified dementia, severe, without behavioral disturbance, psychotic disturbance, mood disturbance, and anxiety: Secondary | ICD-10-CM | POA: Diagnosis not present

## 2024-01-16 DIAGNOSIS — R404 Transient alteration of awareness: Secondary | ICD-10-CM | POA: Diagnosis not present

## 2024-01-16 DIAGNOSIS — G9341 Metabolic encephalopathy: Secondary | ICD-10-CM | POA: Diagnosis not present

## 2024-01-16 DIAGNOSIS — Z950 Presence of cardiac pacemaker: Secondary | ICD-10-CM | POA: Diagnosis not present

## 2024-01-16 DIAGNOSIS — Z7401 Bed confinement status: Secondary | ICD-10-CM | POA: Diagnosis not present

## 2024-01-21 DIAGNOSIS — F0390 Unspecified dementia without behavioral disturbance: Secondary | ICD-10-CM | POA: Diagnosis not present

## 2024-01-21 DIAGNOSIS — G9341 Metabolic encephalopathy: Secondary | ICD-10-CM | POA: Diagnosis not present

## 2024-01-21 DIAGNOSIS — R5381 Other malaise: Secondary | ICD-10-CM | POA: Diagnosis not present

## 2024-01-21 DIAGNOSIS — G8911 Acute pain due to trauma: Secondary | ICD-10-CM | POA: Diagnosis not present

## 2024-01-21 DIAGNOSIS — S72001S Fracture of unspecified part of neck of right femur, sequela: Secondary | ICD-10-CM | POA: Diagnosis not present

## 2024-01-21 DIAGNOSIS — E43 Unspecified severe protein-calorie malnutrition: Secondary | ICD-10-CM | POA: Diagnosis not present

## 2024-01-21 DIAGNOSIS — N400 Enlarged prostate without lower urinary tract symptoms: Secondary | ICD-10-CM | POA: Diagnosis not present

## 2024-01-21 DIAGNOSIS — I10 Essential (primary) hypertension: Secondary | ICD-10-CM | POA: Diagnosis not present

## 2024-01-22 DIAGNOSIS — G8911 Acute pain due to trauma: Secondary | ICD-10-CM | POA: Diagnosis not present

## 2024-01-22 DIAGNOSIS — F0390 Unspecified dementia without behavioral disturbance: Secondary | ICD-10-CM | POA: Diagnosis not present

## 2024-01-22 DIAGNOSIS — N400 Enlarged prostate without lower urinary tract symptoms: Secondary | ICD-10-CM | POA: Diagnosis not present

## 2024-01-22 DIAGNOSIS — S72001S Fracture of unspecified part of neck of right femur, sequela: Secondary | ICD-10-CM | POA: Diagnosis not present

## 2024-01-22 DIAGNOSIS — R5381 Other malaise: Secondary | ICD-10-CM | POA: Diagnosis not present

## 2024-01-22 DIAGNOSIS — I10 Essential (primary) hypertension: Secondary | ICD-10-CM | POA: Diagnosis not present

## 2024-01-23 DIAGNOSIS — F0390 Unspecified dementia without behavioral disturbance: Secondary | ICD-10-CM | POA: Diagnosis not present

## 2024-01-23 DIAGNOSIS — S72001S Fracture of unspecified part of neck of right femur, sequela: Secondary | ICD-10-CM | POA: Diagnosis not present

## 2024-01-23 DIAGNOSIS — G8911 Acute pain due to trauma: Secondary | ICD-10-CM | POA: Diagnosis not present

## 2024-01-23 DIAGNOSIS — R5381 Other malaise: Secondary | ICD-10-CM | POA: Diagnosis not present

## 2024-01-23 DIAGNOSIS — N400 Enlarged prostate without lower urinary tract symptoms: Secondary | ICD-10-CM | POA: Diagnosis not present

## 2024-01-23 DIAGNOSIS — I10 Essential (primary) hypertension: Secondary | ICD-10-CM | POA: Diagnosis not present

## 2024-01-24 DIAGNOSIS — F0390 Unspecified dementia without behavioral disturbance: Secondary | ICD-10-CM | POA: Diagnosis not present

## 2024-01-24 DIAGNOSIS — G8911 Acute pain due to trauma: Secondary | ICD-10-CM | POA: Diagnosis not present

## 2024-01-24 DIAGNOSIS — I10 Essential (primary) hypertension: Secondary | ICD-10-CM | POA: Diagnosis not present

## 2024-01-24 DIAGNOSIS — N400 Enlarged prostate without lower urinary tract symptoms: Secondary | ICD-10-CM | POA: Diagnosis not present

## 2024-01-24 DIAGNOSIS — S72001S Fracture of unspecified part of neck of right femur, sequela: Secondary | ICD-10-CM | POA: Diagnosis not present

## 2024-01-24 DIAGNOSIS — R5381 Other malaise: Secondary | ICD-10-CM | POA: Diagnosis not present

## 2024-02-21 DEATH — deceased
